# Patient Record
Sex: Female | Born: 1960 | Hispanic: No | Smoking: Former smoker
Health system: Southern US, Community
[De-identification: ages and names within clinical notes are randomized; demographics above are authoritative.]

## PROBLEM LIST (undated history)

## (undated) DIAGNOSIS — I639 Cerebral infarction, unspecified: Secondary | ICD-10-CM

## (undated) DIAGNOSIS — M1990 Unspecified osteoarthritis, unspecified site: Secondary | ICD-10-CM

## (undated) DIAGNOSIS — E119 Type 2 diabetes mellitus without complications: Secondary | ICD-10-CM

## (undated) DIAGNOSIS — M069 Rheumatoid arthritis, unspecified: Secondary | ICD-10-CM

## (undated) HISTORY — PX: HAND SURGERY: SHX662

---

## 2001-12-27 ENCOUNTER — Encounter: Admission: RE | Admit: 2001-12-27 | Discharge: 2001-12-27 | Payer: Self-pay | Admitting: Neurosurgery

## 2001-12-27 ENCOUNTER — Encounter: Payer: Self-pay | Admitting: Neurosurgery

## 2002-06-14 ENCOUNTER — Encounter: Admission: RE | Admit: 2002-06-14 | Discharge: 2002-06-14 | Payer: Self-pay

## 2002-06-27 ENCOUNTER — Other Ambulatory Visit: Admission: RE | Admit: 2002-06-27 | Discharge: 2002-06-27 | Payer: Self-pay | Admitting: *Deleted

## 2003-06-03 ENCOUNTER — Encounter: Admission: RE | Admit: 2003-06-03 | Discharge: 2003-06-03 | Payer: Self-pay

## 2005-01-30 ENCOUNTER — Encounter: Admission: RE | Admit: 2005-01-30 | Discharge: 2005-01-30 | Payer: Self-pay

## 2006-07-20 ENCOUNTER — Ambulatory Visit: Payer: Self-pay | Admitting: Vascular Surgery

## 2006-12-16 ENCOUNTER — Ambulatory Visit (HOSPITAL_COMMUNITY): Admission: RE | Admit: 2006-12-16 | Discharge: 2006-12-16 | Payer: Self-pay

## 2011-03-25 ENCOUNTER — Other Ambulatory Visit: Payer: Self-pay | Admitting: Internal Medicine

## 2011-03-25 DIAGNOSIS — M542 Cervicalgia: Secondary | ICD-10-CM

## 2011-03-31 ENCOUNTER — Inpatient Hospital Stay: Admission: RE | Admit: 2011-03-31 | Payer: Self-pay | Source: Ambulatory Visit

## 2011-04-09 ENCOUNTER — Ambulatory Visit
Admission: RE | Admit: 2011-04-09 | Discharge: 2011-04-09 | Disposition: A | Discharge status: Home / Self Care | Payer: Medicare Other | Source: Ambulatory Visit | Attending: Internal Medicine | Admitting: Internal Medicine

## 2011-04-09 DIAGNOSIS — M542 Cervicalgia: Secondary | ICD-10-CM

## 2011-09-23 ENCOUNTER — Other Ambulatory Visit: Payer: Self-pay | Admitting: Internal Medicine

## 2011-09-23 DIAGNOSIS — M25511 Pain in right shoulder: Secondary | ICD-10-CM

## 2011-10-20 ENCOUNTER — Ambulatory Visit
Admission: RE | Admit: 2011-10-20 | Discharge: 2011-10-20 | Disposition: A | Discharge status: Home / Self Care | Payer: Medicare Other | Source: Ambulatory Visit | Attending: Internal Medicine | Admitting: Internal Medicine

## 2011-10-20 DIAGNOSIS — M25511 Pain in right shoulder: Secondary | ICD-10-CM

## 2014-08-15 ENCOUNTER — Inpatient Hospital Stay (HOSPITAL_COMMUNITY): Payer: Medicare Other

## 2014-08-15 ENCOUNTER — Ambulatory Visit (HOSPITAL_COMMUNITY)
Admission: RE | Admit: 2014-08-15 | Discharge: 2014-08-15 | Disposition: A | Discharge status: Home / Self Care | Payer: Medicare Other | Source: Ambulatory Visit | Attending: Endocrinology | Admitting: Endocrinology

## 2014-08-15 ENCOUNTER — Other Ambulatory Visit: Payer: Self-pay

## 2014-08-15 ENCOUNTER — Inpatient Hospital Stay (HOSPITAL_COMMUNITY)
Admission: EM | Admit: 2014-08-15 | Discharge: 2014-08-16 | DRG: 066 | Disposition: A | Discharge status: Home / Self Care | Payer: Medicare Other | Attending: Internal Medicine | Admitting: Internal Medicine

## 2014-08-15 ENCOUNTER — Other Ambulatory Visit (HOSPITAL_COMMUNITY): Payer: Self-pay | Admitting: Endocrinology

## 2014-08-15 ENCOUNTER — Encounter (HOSPITAL_COMMUNITY): Payer: Self-pay | Admitting: *Deleted

## 2014-08-15 DIAGNOSIS — Z9641 Presence of insulin pump (external) (internal): Secondary | ICD-10-CM | POA: Diagnosis present

## 2014-08-15 DIAGNOSIS — Z720 Tobacco use: Secondary | ICD-10-CM | POA: Diagnosis present

## 2014-08-15 DIAGNOSIS — I639 Cerebral infarction, unspecified: Secondary | ICD-10-CM | POA: Diagnosis present

## 2014-08-15 DIAGNOSIS — Z794 Long term (current) use of insulin: Secondary | ICD-10-CM | POA: Diagnosis not present

## 2014-08-15 DIAGNOSIS — F329 Major depressive disorder, single episode, unspecified: Secondary | ICD-10-CM | POA: Diagnosis present

## 2014-08-15 DIAGNOSIS — E785 Hyperlipidemia, unspecified: Secondary | ICD-10-CM | POA: Diagnosis present

## 2014-08-15 DIAGNOSIS — Z79899 Other long term (current) drug therapy: Secondary | ICD-10-CM | POA: Diagnosis not present

## 2014-08-15 DIAGNOSIS — E109 Type 1 diabetes mellitus without complications: Secondary | ICD-10-CM | POA: Diagnosis present

## 2014-08-15 DIAGNOSIS — I6789 Other cerebrovascular disease: Secondary | ICD-10-CM | POA: Diagnosis not present

## 2014-08-15 DIAGNOSIS — R200 Anesthesia of skin: Secondary | ICD-10-CM | POA: Diagnosis present

## 2014-08-15 DIAGNOSIS — F17210 Nicotine dependence, cigarettes, uncomplicated: Secondary | ICD-10-CM | POA: Diagnosis present

## 2014-08-15 DIAGNOSIS — G510 Bell's palsy: Secondary | ICD-10-CM

## 2014-08-15 DIAGNOSIS — M069 Rheumatoid arthritis, unspecified: Secondary | ICD-10-CM | POA: Diagnosis present

## 2014-08-15 HISTORY — DX: Unspecified osteoarthritis, unspecified site: M19.90

## 2014-08-15 HISTORY — DX: Cerebral infarction, unspecified: I63.9

## 2014-08-15 HISTORY — DX: Type 2 diabetes mellitus without complications: E11.9

## 2014-08-15 HISTORY — DX: Rheumatoid arthritis, unspecified: M06.9

## 2014-08-15 LAB — I-STAT CHEM 8, ED
BUN: 9 mg/dL (ref 6–20)
Calcium, Ion: 1.22 mmol/L (ref 1.12–1.23)
Chloride: 101 mmol/L (ref 101–111)
Creatinine, Ser: 0.5 mg/dL (ref 0.44–1.00)
Glucose, Bld: 142 mg/dL — ABNORMAL HIGH (ref 70–99)
HCT: 42 % (ref 36.0–46.0)
Hemoglobin: 14.3 g/dL (ref 12.0–15.0)
Potassium: 3.8 mmol/L (ref 3.5–5.1)
Sodium: 140 mmol/L (ref 135–145)
TCO2: 23 mmol/L (ref 0–100)

## 2014-08-15 LAB — DIFFERENTIAL
Basophils Absolute: 0.1 10*3/uL (ref 0.0–0.1)
Basophils Relative: 2 % — ABNORMAL HIGH (ref 0–1)
Eosinophils Absolute: 0.2 10*3/uL (ref 0.0–0.7)
Eosinophils Relative: 3 % (ref 0–5)
Lymphocytes Relative: 39 % (ref 12–46)
Lymphs Abs: 1.8 10*3/uL (ref 0.7–4.0)
Monocytes Absolute: 0.3 10*3/uL (ref 0.1–1.0)
Monocytes Relative: 6 % (ref 3–12)
Neutro Abs: 2.4 10*3/uL (ref 1.7–7.7)
Neutrophils Relative %: 50 % (ref 43–77)

## 2014-08-15 LAB — COMPREHENSIVE METABOLIC PANEL
ALK PHOS: 120 U/L (ref 38–126)
ALT: 14 U/L (ref 14–54)
ANION GAP: 9 (ref 5–15)
AST: 22 U/L (ref 15–41)
Albumin: 3.9 g/dL (ref 3.5–5.0)
BUN: 8 mg/dL (ref 6–20)
CO2: 26 mmol/L (ref 22–32)
Calcium: 9.3 mg/dL (ref 8.9–10.3)
Chloride: 104 mmol/L (ref 101–111)
Creatinine, Ser: 0.44 mg/dL (ref 0.44–1.00)
GFR calc Af Amer: >60 mL/min (ref >60)
GFR calc non Af Amer: >60 mL/min (ref >60)
Glucose, Bld: 143 mg/dL — ABNORMAL HIGH (ref 70–99)
Potassium: 3.7 mmol/L (ref 3.5–5.1)
Sodium: 139 mmol/L (ref 135–145)
TOTAL PROTEIN: 6.8 g/dL (ref 6.5–8.1)
Total Bilirubin: 0.5 mg/dL (ref 0.3–1.2)

## 2014-08-15 LAB — CBC
HCT: 40.4 % (ref 36.0–46.0)
Hemoglobin: 13.8 g/dL (ref 12.0–15.0)
MCH: 31 pg (ref 26.0–34.0)
MCHC: 34.2 g/dL (ref 30.0–36.0)
MCV: 90.8 fL (ref 78.0–100.0)
Platelets: 200 10*3/uL (ref 150–400)
RBC: 4.45 MIL/uL (ref 3.87–5.11)
RDW: 13.8 % (ref 11.5–15.5)
WBC: 4.7 10*3/uL (ref 4.0–10.5)

## 2014-08-15 LAB — GLUCOSE, CAPILLARY: Glucose-Capillary: 145 mg/dL — ABNORMAL HIGH (ref 70–99)

## 2014-08-15 LAB — CBG MONITORING, ED: Glucose-Capillary: 120 mg/dL — ABNORMAL HIGH (ref 70–99)

## 2014-08-15 LAB — I-STAT TROPONIN, ED: Troponin i, poc: 0 ng/mL (ref 0.00–0.08)

## 2014-08-15 LAB — PROTIME-INR
INR: 0.95 (ref 0.00–1.49)
Prothrombin Time: 12.8 seconds (ref 11.6–15.2)

## 2014-08-15 LAB — APTT: APTT: 27 s (ref 24–37)

## 2014-08-15 MED ORDER — ASPIRIN 300 MG RE SUPP: 300.0000 mg | Freq: Every day | RECTAL | Status: DC

## 2014-08-15 MED ORDER — STROKE: EARLY STAGES OF RECOVERY BOOK
Freq: Once | Status: AC
  Administered 2014-08-15: 22:00:00

## 2014-08-15 MED ORDER — ZOLPIDEM TARTRATE 5 MG PO TABS: 10.0000 mg | ORAL_TABLET | Freq: Every evening | ORAL | Status: DC | PRN

## 2014-08-15 MED ORDER — SODIUM CHLORIDE 0.9 % IV SOLN
INTRAVENOUS | Status: DC
  Administered 2014-08-15: 22:00:00 via INTRAVENOUS

## 2014-08-15 MED ORDER — ASPIRIN 325 MG PO TABS
325.0000 mg | ORAL_TABLET | Freq: Every day | ORAL | Status: DC
  Administered 2014-08-15 – 2014-08-16 (×2): 325 mg via ORAL
  Filled 2014-08-15 (×2): qty 1

## 2014-08-15 MED ORDER — ENOXAPARIN SODIUM 40 MG/0.4ML ~~LOC~~ SOLN
40.0000 mg | SUBCUTANEOUS | Status: DC
  Administered 2014-08-16: 40 mg via SUBCUTANEOUS
  Filled 2014-08-15: qty 0.4

## 2014-08-15 MED ORDER — SENNOSIDES-DOCUSATE SODIUM 8.6-50 MG PO TABS: 1.0000 | ORAL_TABLET | Freq: Every evening | ORAL | Status: DC | PRN

## 2014-08-15 MED ORDER — ZOLPIDEM TARTRATE 5 MG PO TABS
5.0000 mg | ORAL_TABLET | Freq: Every evening | ORAL | Status: DC | PRN
  Administered 2014-08-15: 5 mg via ORAL
  Filled 2014-08-15: qty 1

## 2014-08-15 MED ORDER — VENLAFAXINE HCL ER 75 MG PO CP24
75.0000 mg | ORAL_CAPSULE | Freq: Every day | ORAL | Status: DC
  Administered 2014-08-16: 75 mg via ORAL
  Filled 2014-08-15: qty 1

## 2014-08-15 MED ORDER — INSULIN PUMP
SUBCUTANEOUS | Status: DC
  Administered 2014-08-15: 0.3 via SUBCUTANEOUS
  Administered 2014-08-16: 0.4 via SUBCUTANEOUS
  Administered 2014-08-16: 12:00:00 via SUBCUTANEOUS
  Administered 2014-08-16: 0.1 via SUBCUTANEOUS
  Filled 2014-08-15: qty 1

## 2014-08-15 NOTE — ED Notes (Signed)
She woke up this am with rt sided fascial numbness since 0900am today with numbness in the entire rt side of her body.  She was seen by her doctor this afternoon and had a mri at [facility].  She was called there and was told she needed to come  Straight here.  Her numbness continues about the same

## 2014-08-15 NOTE — ED Notes (Signed)
Pt reports numbness on right side of her body since this am, went to see her endocrinologist who sent her to [facility] for an mri then sent her here. Pt states hx of tia in the past. Speech clear, airway intact, no weakness noted. Pt denies headache.

## 2014-08-15 NOTE — ED Notes (Signed)
The pt has an insulin pump

## 2014-08-15 NOTE — ED Notes (Signed)
i cannot locate any one that knows  Who is supposed to see this pt.   Dr Thad Ranger is in the dept she does not know anything about this pt.

## 2014-08-15 NOTE — ED Provider Notes (Signed)
CSN: [id number]     Arrival date & time 08/15/14  1818 History   First MD Initiated Contact with Patient 08/15/14 1936     Chief Complaint  Patient presents with  . Numbness   Patient is a 54 y.o. female presenting with neurologic complaint. The history is provided by the patient.  Neurologic Problem This is a new problem. Episode onset: pt noticed it when she woke up this morning, last normal was last night. The problem occurs constantly. The problem has not changed since onset.Pertinent negatives include no chest pain, no abdominal pain and no headaches. Nothing aggravates the symptoms. Nothing relieves the symptoms. She has tried nothing for the symptoms.  Numbness on the right side of face and right arm.  Leg feels normal.  No trouble with balance or speech.  No vision complaints.  Past Medical History  Diagnosis Date  . Arthritis   . Diabetes mellitus without complication   . Stroke    History reviewed. No pertinent past surgical history. No family history on file. History  Substance Use Topics  . Smoking status: Current Every Day Smoker  . Smokeless tobacco: Not on file  . Alcohol Use: No   OB History    No data available     Review of Systems  Cardiovascular: Negative for chest pain.  Gastrointestinal: Negative for abdominal pain.  Neurological: Negative for headaches.  All other systems reviewed and are negative.     Allergies  Review of patient's allergies indicates no known allergies.  Home Medications   Prior to Admission medications   Not on File   BP 155/91 mmHg  Pulse 82  Temp(Src) 98.9 F (37.2 C)  Resp 12  Ht 5\' 4"  (1.626 m)  Wt 132 lb (59.875 kg)  BMI 22.65 kg/m2  SpO2 100% Physical Exam  Constitutional: She is oriented to person, place, and time. She appears well-developed and well-nourished. No distress.  HENT:  Head: Normocephalic and atraumatic.  Right Ear: External ear normal.  Left Ear: External ear normal.  Mouth/Throat: Oropharynx  is clear and moist.  Eyes: Conjunctivae are normal. Right eye exhibits no discharge. Left eye exhibits no discharge. No scleral icterus.  Neck: Neck supple. No tracheal deviation present.  Cardiovascular: Normal rate, regular rhythm and intact distal pulses.   Pulmonary/Chest: Effort normal and breath sounds normal. No stridor. No respiratory distress. She has no wheezes. She has no rales.  Abdominal: Soft. Bowel sounds are normal. She exhibits no distension. There is no tenderness. There is no rebound and no guarding.  Musculoskeletal: She exhibits no edema or tenderness.  Neurological: She is alert and oriented to person, place, and time. She has normal strength. A sensory deficit is present. No cranial nerve deficit (No facial droop, extraocular movements intact, tongue midline ). She exhibits normal muscle tone. She displays no seizure activity. Coordination normal.  No pronator drift bilateral upper extrem, able to hold both legs off bed for 5 seconds, sensation intact in all extremities but decreased rue, no visual field cuts, no left or right sided neglect, normal finger-nose exam bilaterally, no nystagmus noted   Skin: Skin is warm and dry. No rash noted.  Psychiatric: She has a normal mood and affect.  Nursing note and vitals reviewed.   ED Course  Procedures (including critical care time) Labs Review Labs Reviewed  DIFFERENTIAL - Abnormal; Notable for the following:    Basophils Relative 2 (*)    All other components within normal limits  I-STAT CHEM 8,  ED - Abnormal; Notable for the following:    Glucose, Bld 142 (*)    All other components within normal limits  PROTIME-INR  APTT  CBC  COMPREHENSIVE METABOLIC PANEL  I-STAT TROPOININ, ED  CBG MONITORING, ED    Imaging Review Mr Brain Wo Contrast  08/15/2014   CLINICAL DATA:  RIGHT-sided facial numbness extending to the RIGHT upper extremity that began earlier today.  EXAM: MRI HEAD WITHOUT CONTRAST  TECHNIQUE: Multiplanar,  multiecho pulse sequences of the brain and surrounding structures were obtained without intravenous contrast.  COMPARISON:  MR brain report 01/29/2012.  FINDINGS: There is a 5 x 8 mm area of restricted diffusion involving the LEFT thalamus consistent with an acute infarction. No other areas of restricted diffusion are seen.  No hemorrhage, mass lesion, or extra-axial fluid. Moderately advanced atrophy for age. Chronic microvascular ischemic change of a moderately extensive degree affects the periventricular and subcortical white matter, also the brainstem. No midline abnormalities.  Patent flow voids in the carotid, basilar, and vertebral arteries. No visible proximal large vessel occlusion intracranially, specifically with regard to the LEFT PCA.  Pituitary, pineal, and cerebellar tonsils unremarkable. No upper cervical lesions. Visualized calvarium, skull base, and upper cervical osseous structures unremarkable. Scalp and extracranial soft tissues, orbits, sinuses, and mastoids show no acute process.  Compared to the report from [facility], atrophy and small vessel disease were described previously.  IMPRESSION: Acute nonhemorrhagic subcentimeter infarct involving the LEFT thalamus. The location of this abnormality would explain the patient's sensory symptoms.  No evidence for intracranial large vessel occlusion. The observed infarction likely represents a small vessel insult.  Moderately advanced atrophy for age with extensive chronic microvascular ischemic change.  These results will be called to the ordering clinician or representative by the Radiologist Assistant, and communication documented in the PACS or zVision Dashboard.   Electronically Signed   By: Davonna Belling M.D.   On: 08/15/2014 16:14    EKG Normal sinus rhythm rate 81 Normal axis, normal intervals No prior EKG for comparison  MDM   Final diagnoses:  Stroke    Patient presents to emergency room with a confirmed stroke demonstrated on an  MRI performed as an outpatient today. Not a TPA or vascular intervention candidate.  The last time she was known to be asymptomatic was instructed to bed last evening.  I will consult with the medical service for admission and further stroke workup.   Linwood Dibbles, MD 08/15/14 2027

## 2014-08-15 NOTE — ED Notes (Signed)
The pts husband called the pts doctor and was told that she had a stroke and was supposed to see the stroke specialist on call.  Dr Thad Ranger is here in the dept and she has no idea  About this pt.

## 2014-08-15 NOTE — ED Notes (Signed)
MD at bedside. 

## 2014-08-15 NOTE — ED Notes (Signed)
BLOOD GLUCOSE 120

## 2014-08-15 NOTE — Consult Note (Signed)
Referring Physician: Dr. Hal Hope    Chief Complaint: numbness right face-arm, stroke on MRI  HPI:                                                                                                                                         Joyce Steele is an 54 y.o. female, right handed, with a past medical history significant for DM type 1, RA, smoking, admitted to [facility] for further evaluation of the above stated symptoms. Stated that she never had similar symptoms before. Went to bed last night feeling well but woke up around 8:45 this morning with a numb sensation involving her right face including right side of her lips and tongue as well as the right arm. She denied associated HA, vertigo, double vision, difficulty swallowing, focal weakness, unsteadiness, confusion, slurred speech, language or vision impairment. She indicated that her symptoms did not get better and drove to her endocrinologist office who sent her to [facility] for urgent MRI brain. I did review her brain MRI and there is evidence of an acute left lacunar infarct. Joyce Steele said that her symptoms remain unchanged.  Date last known well: 08/14/14 Time last known well: undetermined tPA Given: no, out of the window   Past Medical History  Diagnosis Date  . Arthritis   . Diabetes mellitus without complication   . Stroke   . Rheumatoid arthritis     Past Surgical History  Procedure Laterality Date  . Hand surgery      Family History  Problem Relation Age of Onset  . Stroke Mother   . Rheum arthritis Mother    Social History:  reports that she has been smoking.  She does not have any smokeless tobacco history on file. She reports that she does not drink alcohol. Her drug history is not on file. Family history: mother had large stroke at age 32, no brain tumors, epilepsy, MS, or brain aneurysms Allergies: No Known Allergies  Medications:                                                                                                                            Scheduled: . aspirin  300 mg Rectal Daily   Or  . aspirin  325 mg Oral Daily  . [START ON 08/16/2014] enoxaparin (LOVENOX) injection  40 mg Subcutaneous Q24H  . insulin pump  Subcutaneous 6 times per day  . [START ON 08/16/2014] venlafaxine XR  75 mg Oral Q breakfast    ROS:                                                                                                                                       History obtained from the patient and chart review  General ROS: negative for - chills, fatigue, fever, night sweats, weight gain or weight loss Psychological ROS: negative for - behavioral disorder, hallucinations, memory difficulties, mood swings or suicidal ideation Ophthalmic ROS: negative for - blurry vision, double vision, eye pain or loss of vision ENT ROS: negative for - epistaxis, nasal discharge, oral lesions, sore throat, tinnitus or vertigo Allergy and Immunology ROS: negative for - hives or itchy/watery eyes Hematological and Lymphatic ROS: negative for - bleeding problems, bruising or swollen lymph nodes Endocrine ROS: negative for - galactorrhea, hair pattern changes, polydipsia/polyuria or temperature intolerance Respiratory ROS: negative for - cough, hemoptysis, shortness of breath or wheezing Cardiovascular ROS: negative for - chest pain, dyspnea on exertion, edema or irregular heartbeat Gastrointestinal ROS: negative for - abdominal pain, diarrhea, hematemesis, nausea/vomiting or stool incontinence Genito-Urinary ROS: negative for - dysuria, hematuria, incontinence or urinary frequency/urgency Musculoskeletal ROS: negative for - joint swelling or muscular weakness Neurological ROS: as noted in HPI Dermatological ROS: negative for rash and skin lesion changes   Physical exam: pleasant female in no apparent distress. Blood pressure 165/82, pulse 83, temperature 98.6 F (37 C), temperature source Oral, resp. rate 16, height 5' 4"  (1.626  m), weight 59.875 kg (132 lb), SpO2 98 %. Head: normocephalic. Neck: supple, no bruits, no JVD. Cardiac: no murmurs. Lungs: clear. Abdomen: soft, no tender, no mass. Extremities: no edema, clubbing, or cyanosis. Skin: no rash Neurologic Examination:                                                                                                      General: Mental Status: Alert, oriented, thought content appropriate.  Speech fluent without evidence of aphasia.  Able to follow 3 step commands without difficulty. Cranial Nerves: II: Discs flat bilaterally; Visual fields grossly normal, pupils equal, round, reactive to light and accommodation III,IV, VI: ptosis not present, extra-ocular motions intact bilaterally V,VII: smile symmetric, facial light touch sensation normal bilaterally VIII: hearing normal bilaterally IX,X: uvula rises symmetrically XI: bilateral shoulder shrug XII: midline tongue extension without atrophy or fasciculations  Motor: Right : Upper extremity   5/5  Left:     Upper extremity   5/5  Lower extremity   5/5     Lower extremity   5/5 Tone and bulk:normal tone throughout; no atrophy noted Sensory: Pinprick and light touch intact throughout, bilaterally Deep Tendon Reflexes:  Right: Upper Extremity   Left: Upper extremity   biceps (C-5 to C-6) 2/4   biceps (C-5 to C-6) 2/4 tricep (C7) 2/4    triceps (C7) 2/4 Brachioradialis (C6) 2/4  Brachioradialis (C6) 2/4  Lower Extremity Lower Extremity  quadriceps (L-2 to L-4) 2/4   quadriceps (L-2 to L-4) 2/4 Achilles (S1) 2/4   Achilles (S1) 2/4  Plantars: Right: downgoing   Left: downgoing Cerebellar: normal finger-to-nose,  normal heel-to-shin test Gait:  No tested due to multiple leads     Results for orders placed or performed during the hospital encounter of 08/15/14 (from the past 48 hour(s))  Protime-INR     Status: None   Collection Time: 08/15/14  7:06 PM  Result Value Ref Range   Prothrombin Time  12.8 11.6 - 15.2 seconds   INR 0.95 0.00 - 1.49  APTT     Status: None   Collection Time: 08/15/14  7:06 PM  Result Value Ref Range   aPTT 27 24 - 37 seconds  CBC     Status: None   Collection Time: 08/15/14  7:06 PM  Result Value Ref Range   WBC 4.7 4.0 - 10.5 K/uL   RBC 4.45 3.87 - 5.11 MIL/uL   Hemoglobin 13.8 12.0 - 15.0 g/dL   HCT 40.4 36.0 - 46.0 %   MCV 90.8 78.0 - 100.0 fL   MCH 31.0 26.0 - 34.0 pg   MCHC 34.2 30.0 - 36.0 g/dL   RDW 13.8 11.5 - 15.5 %   Platelets 200 150 - 400 K/uL  Differential     Status: Abnormal   Collection Time: 08/15/14  7:06 PM  Result Value Ref Range   Neutrophils Relative % 50 43 - 77 %   Neutro Abs 2.4 1.7 - 7.7 K/uL   Lymphocytes Relative 39 12 - 46 %   Lymphs Abs 1.8 0.7 - 4.0 K/uL   Monocytes Relative 6 3 - 12 %   Monocytes Absolute 0.3 0.1 - 1.0 K/uL   Eosinophils Relative 3 0 - 5 %   Eosinophils Absolute 0.2 0.0 - 0.7 K/uL   Basophils Relative 2 (H) 0 - 1 %   Basophils Absolute 0.1 0.0 - 0.1 K/uL  Comprehensive metabolic panel     Status: Abnormal   Collection Time: 08/15/14  7:06 PM  Result Value Ref Range   Sodium 139 135 - 145 mmol/L   Potassium 3.7 3.5 - 5.1 mmol/L   Chloride 104 101 - 111 mmol/L   CO2 26 22 - 32 mmol/L   Glucose, Bld 143 (H) 70 - 99 mg/dL   BUN 8 6 - 20 mg/dL   Creatinine, Ser 0.44 0.44 - 1.00 mg/dL   Calcium 9.3 8.9 - 10.3 mg/dL   Total Protein 6.8 6.5 - 8.1 g/dL   Albumin 3.9 3.5 - 5.0 g/dL   AST 22 15 - 41 U/L   ALT 14 14 - 54 U/L   Alkaline Phosphatase 120 38 - 126 U/L   Total Bilirubin 0.5 0.3 - 1.2 mg/dL   GFR calc non Af Amer >60 >60 mL/min   GFR calc Af Amer >60 >60 mL/min    Comment: (NOTE) The eGFR has been calculated using the CKD EPI  equation. This calculation has not been validated in all clinical situations. eGFR's persistently <90 mL/min signify possible Chronic Kidney Disease.    Anion gap 9 5 - 15  I-stat troponin, ED (not at [facility], [facility])     Status: None   Collection Time: 08/15/14   7:25 PM  Result Value Ref Range   Troponin i, poc 0.00 0.00 - 0.08 ng/mL   Comment 3            Comment: Due to the release kinetics of cTnI, a negative result within the first hours of the onset of symptoms does not rule out myocardial infarction with certainty. If myocardial infarction is still suspected, repeat the test at appropriate intervals.   I-Stat Chem 8, ED  (not at [facility], [facility])     Status: Abnormal   Collection Time: 08/15/14  7:27 PM  Result Value Ref Range   Sodium 140 135 - 145 mmol/L   Potassium 3.8 3.5 - 5.1 mmol/L   Chloride 101 101 - 111 mmol/L   BUN 9 6 - 20 mg/dL   Creatinine, Ser 0.50 0.44 - 1.00 mg/dL   Glucose, Bld 142 (H) 70 - 99 mg/dL   Calcium, Ion 1.22 1.12 - 1.23 mmol/L   TCO2 23 0 - 100 mmol/L   Hemoglobin 14.3 12.0 - 15.0 g/dL   HCT 42.0 36.0 - 46.0 %  CBG monitoring, ED     Status: Abnormal   Collection Time: 08/15/14  8:07 PM  Result Value Ref Range   Glucose-Capillary 120 (H) 70 - 99 mg/dL   Mr Brain Wo Contrast  08/15/2014   CLINICAL DATA:  RIGHT-sided facial numbness extending to the RIGHT upper extremity that began earlier today.  EXAM: MRI HEAD WITHOUT CONTRAST  TECHNIQUE: Multiplanar, multiecho pulse sequences of the brain and surrounding structures were obtained without intravenous contrast.  COMPARISON:  MR brain report 01/29/2012.  FINDINGS: There is a 5 x 8 mm area of restricted diffusion involving the LEFT thalamus consistent with an acute infarction. No other areas of restricted diffusion are seen.  No hemorrhage, mass lesion, or extra-axial fluid. Moderately advanced atrophy for age. Chronic microvascular ischemic change of a moderately extensive degree affects the periventricular and subcortical white matter, also the brainstem. No midline abnormalities.  Patent flow voids in the carotid, basilar, and vertebral arteries. No visible proximal large vessel occlusion intracranially, specifically with regard to the LEFT PCA.  Pituitary, pineal,  and cerebellar tonsils unremarkable. No upper cervical lesions. Visualized calvarium, skull base, and upper cervical osseous structures unremarkable. Scalp and extracranial soft tissues, orbits, sinuses, and mastoids show no acute process.  Compared to the report from [facility], atrophy and small vessel disease were described previously.  IMPRESSION: Acute nonhemorrhagic subcentimeter infarct involving the LEFT thalamus. The location of this abnormality would explain the patient's sensory symptoms.  No evidence for intracranial large vessel occlusion. The observed infarction likely represents a small vessel insult.  Moderately advanced atrophy for age with extensive chronic microvascular ischemic change.  These results will be called to the ordering clinician or representative by the Radiologist Assistant, and communication documented in the PACS or zVision Dashboard.   Electronically Signed   By: Rolla Flatten M.D.   On: 08/15/2014 16:14    Assessment: 54 y.o. female with new onset of right face-arm numbness and MRI brain confirming an acute lacunar infarct left thalamus, most likely due to small vessel disease. Complete stroke work up. Aspirin 81 mg daily. Stroke team will resume care tomorrow.  Stroke Risk Factors - DM  Plan: 1. HgbA1c, fasting lipid panel 2. MRI, MRA  of the brain without contrast 3. Echocardiogram 4. Carotid dopplers 5. Prophylactic therapy-ASPIRIN 6. Risk factor modification 7. Telemetry monitoring 8. Frequent neuro checks 9. PT/OT SLP (no needed at this time)  Dorian Pod, MD [facility] [phone]  08/15/2014, 9:56 PM

## 2014-08-15 NOTE — H&P (Signed)
[facility] History and Physical  Vanya K. Leonor Liv JIR:[id number] DOB: 1960/07/21 DOA: 08/15/2014  Referring physician: Dr. Lynelle Doctor. PCP: No primary care provider on file. Dr. Evlyn Kanner. Specialists: Dr. Dierdre Forth. Rheumatologist.  Chief Complaint: Right facial numbness.  HPI: Joyce Steele is a 54 y.o. female with history of diabetes mellitus type 1 on insulin pump, ongoing tobacco abuse, rheumatoid arthritis presently on no medication started experiencing right facial numbness after waking up in the morning 9 AM. Patient's numbness was on the entire right face involving the scalp also. Patient also felt that she had some discoordination of her right upper extremity. Denies any visual symptoms difficulty talking swallowing or loss of function of upper or lower extremities. Patient had gone to her PCP when ordered MRI brain which showed left thalamic stroke and patient was referred to the ER. Patient otherwise denies any chest pain or shortness of breath nausea vomiting abdominal pain loss of consciousness palpitations.   Review of Systems: As presented in the history of presenting illness, rest negative.  Past Medical History  Diagnosis Date  . Arthritis   . Diabetes mellitus without complication   . Stroke   . Rheumatoid arthritis    Past Surgical History  Procedure Laterality Date  . Hand surgery     Social History:  reports that she has been smoking.  She does not have any smokeless tobacco history on file. She reports that she does not drink alcohol. Her drug history is not on file. Where does patient live home. Can patient participate in ADLs? Yes.  No Known Allergies  Family History:  Family History  Problem Relation Age of Onset  . Stroke Mother   . Rheum arthritis Mother       Prior to Admission medications   Medication Sig Start Date End Date Taking? Authorizing Provider  insulin aspart (NOVOLOG) 100 UNIT/ML injection Inject 30 Units into the skin 3 (three) times daily  before meals.   Yes Historical Provider, MD  venlafaxine XR (EFFEXOR-XR) 75 MG 24 hr capsule Take 75 mg by mouth daily with breakfast.   Yes Historical Provider, MD  zolpidem (AMBIEN CR) 12.5 MG CR tablet Take 12.5 mg by mouth at bedtime. Take every night per patient   Yes Historical Provider, MD    Physical Exam: Filed Vitals:   08/15/14 2034 08/15/14 2040 08/15/14 2045 08/15/14 2107  BP: 156/73  156/83 165/82  Pulse: 85 87 88 83  Temp:    98.6 F (37 C)  TempSrc:    Oral  Resp:  14 14 16   Height:      Weight:      SpO2: 98% 100% 99% 98%     General:  Well-developed and nourished.  Eyes: Anicteric no pallor.  ENT: No discharge from the ears eyes nose or mouth.  Neck: No mass felt.  Cardiovascular: S1 and S2 heard.  Respiratory: No rhonchi or crepitations.  Abdomen: Soft nontender bowel sounds present.  Skin: No rash.  Musculoskeletal: No edema.  Psychiatric: Appears normal.  Neurologic: Alert awake oriented to time place and person. Moves all extremities 5 x 5. No facial asymmetry. Tongue is midline. PERRLA positive.  Labs on Admission:  Basic Metabolic Panel:  Recent Labs Lab 08/15/14 1906 08/15/14 1927  NA 139 140  K 3.7 3.8  CL 104 101  CO2 26  --   GLUCOSE 143* 142*  BUN 8 9  CREATININE 0.44 0.50  CALCIUM 9.3  --    Liver Function Tests:  Recent  Labs Lab 08/15/14 1906  AST 22  ALT 14  ALKPHOS 120  BILITOT 0.5  PROT 6.8  ALBUMIN 3.9   No results for input(s): LIPASE, AMYLASE in the last 168 hours. No results for input(s): AMMONIA in the last 168 hours. CBC:  Recent Labs Lab 08/15/14 1906 08/15/14 1927  WBC 4.7  --   NEUTROABS 2.4  --   HGB 13.8 14.3  HCT 40.4 42.0  MCV 90.8  --   PLT 200  --    Cardiac Enzymes: No results for input(s): CKTOTAL, CKMB, CKMBINDEX, TROPONINI in the last 168 hours.  BNP (last 3 results) No results for input(s): BNP in the last 8760 hours.  ProBNP (last 3 results) No results for input(s):  PROBNP in the last 8760 hours.  CBG:  Recent Labs Lab 08/15/14 2007  GLUCAP 120*    Radiological Exams on Admission: Mr Brain Wo Contrast  08/15/2014   CLINICAL DATA:  RIGHT-sided facial numbness extending to the RIGHT upper extremity that began earlier today.  EXAM: MRI HEAD WITHOUT CONTRAST  TECHNIQUE: Multiplanar, multiecho pulse sequences of the brain and surrounding structures were obtained without intravenous contrast.  COMPARISON:  MR brain report 01/29/2012.  FINDINGS: There is a 5 x 8 mm area of restricted diffusion involving the LEFT thalamus consistent with an acute infarction. No other areas of restricted diffusion are seen.  No hemorrhage, mass lesion, or extra-axial fluid. Moderately advanced atrophy for age. Chronic microvascular ischemic change of a moderately extensive degree affects the periventricular and subcortical white matter, also the brainstem. No midline abnormalities.  Patent flow voids in the carotid, basilar, and vertebral arteries. No visible proximal large vessel occlusion intracranially, specifically with regard to the LEFT PCA.  Pituitary, pineal, and cerebellar tonsils unremarkable. No upper cervical lesions. Visualized calvarium, skull base, and upper cervical osseous structures unremarkable. Scalp and extracranial soft tissues, orbits, sinuses, and mastoids show no acute process.  Compared to the report from priors, atrophy and small vessel disease were described previously.  IMPRESSION: Acute nonhemorrhagic subcentimeter infarct involving the LEFT thalamus. The location of this abnormality would explain the patient's sensory symptoms.  No evidence for intracranial large vessel occlusion. The observed infarction likely represents a small vessel insult.  Moderately advanced atrophy for age with extensive chronic microvascular ischemic change.  These results will be called to the ordering clinician or representative by the Radiologist Assistant, and communication  documented in the PACS or zVision Dashboard.   Electronically Signed   By: Davonna Belling M.D.   On: 08/15/2014 16:14    EKG: Independently reviewed. Normal sinus rhythm.  Assessment/Plan Principal Problem:   Stroke Active Problems:   Rheumatoid arthritis   Diabetes mellitus type 1, controlled   Tobacco abuse   1. Stroke - I have discussed with Dr. Cyril Mourning on-call neurologist. At this time patient has been placed on stroke so evaluation and neuro checks and I have ordered MRA brain 2-D echo carotid Doppler and patient will be closely monitored in telemetry. Get physical therapy consult. 2. Diabetes mellitus type 1 on insulin pump - insulin pump will be continued. Patient states her blood sugars being well controlled and her recent hemoglobin A1c last month was 6.2. Closely follow CBGs. 3. Tobacco abuse - patient strongly advised to quit tobacco use. 4. Rheumatoid arthritis - presently on no medications.   DVT Prophylaxis Lovenox.  Code Status: Full code.  Family Communication: Discussed with patient.  Disposition Plan: Admit to inpatient.    Emett Stapel N. [facility] Pager [phone].  If 7PM-7AM, please contact night-coverage [email or web address] Password [facility] 08/15/2014, 9:27 PM

## 2014-08-16 ENCOUNTER — Inpatient Hospital Stay (HOSPITAL_COMMUNITY): Payer: Medicare Other

## 2014-08-16 ENCOUNTER — Other Ambulatory Visit: Payer: Self-pay | Admitting: Nurse Practitioner

## 2014-08-16 ENCOUNTER — Encounter (HOSPITAL_COMMUNITY): Payer: Self-pay | Admitting: Nurse Practitioner

## 2014-08-16 DIAGNOSIS — E785 Hyperlipidemia, unspecified: Secondary | ICD-10-CM

## 2014-08-16 DIAGNOSIS — I639 Cerebral infarction, unspecified: Principal | ICD-10-CM

## 2014-08-16 DIAGNOSIS — M069 Rheumatoid arthritis, unspecified: Secondary | ICD-10-CM

## 2014-08-16 DIAGNOSIS — I6789 Other cerebrovascular disease: Secondary | ICD-10-CM

## 2014-08-16 DIAGNOSIS — E109 Type 1 diabetes mellitus without complications: Secondary | ICD-10-CM

## 2014-08-16 DIAGNOSIS — Z720 Tobacco use: Secondary | ICD-10-CM

## 2014-08-16 LAB — COMPREHENSIVE METABOLIC PANEL
ALBUMIN: 3.4 g/dL — AB (ref 3.5–5.0)
ALT: 14 U/L (ref 14–54)
ANION GAP: 6 (ref 5–15)
AST: 19 U/L (ref 15–41)
Alkaline Phosphatase: 114 U/L (ref 38–126)
BUN: 9 mg/dL (ref 6–20)
CHLORIDE: 105 mmol/L (ref 101–111)
CO2: 28 mmol/L (ref 22–32)
Calcium: 9 mg/dL (ref 8.9–10.3)
Creatinine, Ser: 0.47 mg/dL (ref 0.44–1.00)
GFR calc Af Amer: >60 mL/min (ref >60)
GFR calc non Af Amer: >60 mL/min (ref >60)
Glucose, Bld: 117 mg/dL — ABNORMAL HIGH (ref 70–99)
Potassium: 3.9 mmol/L (ref 3.5–5.1)
SODIUM: 139 mmol/L (ref 135–145)
TOTAL PROTEIN: 6 g/dL — AB (ref 6.5–8.1)
Total Bilirubin: 0.5 mg/dL (ref 0.3–1.2)

## 2014-08-16 LAB — CBC WITH DIFFERENTIAL/PLATELET
BASOS ABS: 0.1 10*3/uL (ref 0.0–0.1)
BASOS PCT: 2 % — AB (ref 0–1)
EOS ABS: 0.3 10*3/uL (ref 0.0–0.7)
Eosinophils Relative: 6 % — ABNORMAL HIGH (ref 0–5)
HCT: 38.5 % (ref 36.0–46.0)
Hemoglobin: 13 g/dL (ref 12.0–15.0)
LYMPHS PCT: 50 % — AB (ref 12–46)
Lymphs Abs: 2.5 10*3/uL (ref 0.7–4.0)
MCH: 30.8 pg (ref 26.0–34.0)
MCHC: 33.8 g/dL (ref 30.0–36.0)
MCV: 91.2 fL (ref 78.0–100.0)
MONO ABS: 0.3 10*3/uL (ref 0.1–1.0)
Monocytes Relative: 6 % (ref 3–12)
Neutro Abs: 1.8 10*3/uL (ref 1.7–7.7)
Neutrophils Relative %: 36 % — ABNORMAL LOW (ref 43–77)
PLATELETS: 182 10*3/uL (ref 150–400)
RBC: 4.22 MIL/uL (ref 3.87–5.11)
RDW: 13.8 % (ref 11.5–15.5)
WBC: 5 10*3/uL (ref 4.0–10.5)

## 2014-08-16 LAB — GLUCOSE, CAPILLARY
GLUCOSE-CAPILLARY: 149 mg/dL — AB (ref 70–99)
GLUCOSE-CAPILLARY: 81 mg/dL (ref 70–99)
GLUCOSE-CAPILLARY: 189 mg/dL — AB (ref 70–99)
Glucose-Capillary: 154 mg/dL — ABNORMAL HIGH (ref 70–99)

## 2014-08-16 LAB — LIPID PANEL
Cholesterol: 217 mg/dL — ABNORMAL HIGH (ref 0–200)
HDL: 43 mg/dL (ref >40)
LDL CALC: 153 mg/dL — AB (ref 0–99)
TRIGLYCERIDES: 104 mg/dL (ref <150)
Total CHOL/HDL Ratio: 5 RATIO
VLDL: 21 mg/dL (ref 0–40)

## 2014-08-16 MED ORDER — ASPIRIN 325 MG PO TABS: 325.0000 mg | ORAL_TABLET | Freq: Every day | ORAL | Status: DC

## 2014-08-16 MED ORDER — ATORVASTATIN CALCIUM 40 MG PO TABS: 40.0000 mg | ORAL_TABLET | Freq: Every day | ORAL | Status: DC

## 2014-08-16 MED ORDER — ACETAMINOPHEN 325 MG PO TABS
650.0000 mg | ORAL_TABLET | Freq: Four times a day (QID) | ORAL | Status: DC | PRN
  Administered 2014-08-16: 650 mg via ORAL
  Filled 2014-08-16: qty 2

## 2014-08-16 NOTE — Progress Notes (Signed)
VASCULAR LAB PRELIMINARY  PRELIMINARY  PRELIMINARY  PRELIMINARY  Carotid Dopplers completed.    Preliminary report:  1-39% ICA stenosis.  Vertebral artery flow is antegrade.   Kent Braunschweig, RVT 08/16/2014, 9:33 AM

## 2014-08-16 NOTE — Progress Notes (Signed)
STROKE TEAM PROGRESS NOTE   HISTORY Joyce Steele is an 54 y.o. female, right handed, with a past medical history significant for DM type 1, RA, smoking, admitted to [facility] for further evaluation of numbness right face-arm. Stated that she never had similar symptoms before. Went to bed last night feeling well but woke up around 8:45 this morning with a numb sensation involving her right face including right side of her lips and tongue as well as the right arm. (LKW 08/14/2014, time unknown). She denied associated HA, vertigo, double vision, difficulty swallowing, focal weakness, unsteadiness, confusion, slurred speech, language or vision impairment. She indicated that her symptoms did not get better and drove to her endocrinologist office who sent her to [facility] for urgent MRI brain. I did review her brain MRI and there is evidence of an acute left lacunar infarct. Joyce Steele said that her symptoms remain unchanged. Patient was not administered TPA secondary to delay in arrival. She was admitted for further evaluation and treatment.   SUBJECTIVE (INTERVAL HISTORY) No family  is at the bedside.  Overall she feels her condition is stable. She still has some numbness in her face, otherwise, she is at baseline.   OBJECTIVE Temp:  [97.7 F (36.5 C)-98.9 F (37.2 C)] 98.3 F (36.8 C) (05/05 0945) Pulse Rate:  [78-99] 80 (05/05 0945) Cardiac Rhythm:  [-] Normal sinus rhythm (05/04 2107) Resp:  [12-18] 16 (05/05 0730) BP: (128-165)/(65-94) 150/66 mmHg (05/05 0945) SpO2:  [97 %-100 %] 100 % (05/05 0945) Weight:  [58.968 kg (130 lb)-59.875 kg (132 lb)] 59.875 kg (132 lb) (05/04 1832)   Recent Labs Lab 08/15/14 2007 08/15/14 2156 08/16/14 0009 08/16/14 0407 08/16/14 0743  GLUCAP 120* 145* 154* 149* 81    Recent Labs Lab 08/15/14 1906 08/15/14 1927 08/16/14 0531  NA 139 140 139  K 3.7 3.8 3.9  CL 104 101 105  CO2 26  --  28  GLUCOSE 143* 142* 117*  BUN 8 9 9   CREATININE 0.44 0.50 0.47  CALCIUM  9.3  --  9.0    Recent Labs Lab 08/15/14 1906 08/16/14 0531  AST 22 19  ALT 14 14  ALKPHOS 120 114  BILITOT 0.5 0.5  PROT 6.8 6.0*  ALBUMIN 3.9 3.4*    Recent Labs Lab 08/15/14 1906 08/15/14 1927 08/16/14 0531  WBC 4.7  --  5.0  NEUTROABS 2.4  --  1.8  HGB 13.8 14.3 13.0  HCT 40.4 42.0 38.5  MCV 90.8  --  91.2  PLT 200  --  182   No results for input(s): CKTOTAL, CKMB, CKMBINDEX, TROPONINI in the last 168 hours.  Recent Labs  08/15/14 1906  LABPROT 12.8  INR 0.95   No results for input(s): COLORURINE, LABSPEC, PHURINE, GLUCOSEU, HGBUR, BILIRUBINUR, KETONESUR, PROTEINUR, UROBILINOGEN, NITRITE, LEUKOCYTESUR in the last 72 hours.  Invalid input(s): APPERANCEUR     Component Value Date/Time   CHOL 217* 08/16/2014 0531   TRIG 104 08/16/2014 0531   HDL 43 08/16/2014 0531   CHOLHDL 5.0 08/16/2014 0531   VLDL 21 08/16/2014 0531   LDLCALC 153* 08/16/2014 0531   No results found for: HGBA1C No results found for: LABOPIA, COCAINSCRNUR, LABBENZ, AMPHETMU, THCU, LABBARB  No results for input(s): ETH in the last 168 hours.   Dg Chest 2 View 08/15/2014    No acute cardiopulmonary process.  Chronic bronchitic markings.    Mr Brain Wo Contrast 08/15/2014    Acute nonhemorrhagic subcentimeter infarct involving the LEFT thalamus. The  location of this abnormality would explain the patient's sensory symptoms.  No evidence for intracranial large vessel occlusion. The observed infarction likely represents a small vessel insult.  Moderately advanced atrophy for age with extensive chronic microvascular ischemic change.      Mr Maxine Glenn Head/brain Wo Cm 08/16/2014    Moderate mid to distal posterior cerebral artery luminal regularity, mild within the anterior circulation in a pattern suggesting atherosclerosis. No large vessel occlusion.    Carotid Doppler  There is 1-39% bilateral ICA stenosis. Vertebral artery flow is antegrade.    2D Echocardiogram   - Left ventricle: The cavity size  was normal. Wall thickness wasnormal. Systolic function was normal. The estimated ejectionfraction was in the range of 60% to 65%. Wall motion was normal;there were no regional wall motion abnormalities. Dopplerparameters are consistent with abnormal left ventricularrelaxation (grade 1 diastolic dysfunction). - Aortic valve: There was no stenosis. - Mitral valve: There was trivial regurgitation. - Right ventricle: The cavity size was normal. Systolic functionwas normal. - Tricuspid valve: Peak RV-RA gradient (S): 23 mm Hg. - Pulmonary arteries: PA peak pressure: 26 mm Hg (S). - Inferior vena cava: The vessel was normal in size. Therespirophasic diameter changes were in the normal range (>= 50%),consistent with normal central venous pressure. Impressions:  Normal LV size and systolic function, EF 60-65%. Normal RV sizeand systolic function. No significant valvular abnormalities.   PHYSICAL EXAM Pleasant frail middle aged lady not in distress. . Afebrile. Head is nontraumatic. Neck is supple without bruit.    Cardiac exam no murmur or gallop. Lungs are clear to auscultation. Distal pulses are well felt. Neurological Exam ;  Awake  Alert oriented x 3. Normal speech and language.eye movements full without nystagmus.fundi were not visualized. Vision acuity and fields appear normal. Hearing is normal. Palatal movements are normal. Face symmetric. Tongue midline. Normal strength, tone, reflexes and coordination. Normal sensation but subjective paresthesias on the right side of face and upper extremity only. Gait deferred.  ASSESSMENT/PLAN Joyce Steele. Steele is a 54 y.o. female with history of DM type 1, RA, smoking presenting with right facial numbness. She did not receive IV t-PA due to delay in arrival.    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 Pearlean Brownie in 2 months. Order written  If facial numbness does not resolve, can consider treatment with medication  Hypertension  Stable  Hyperlipidemia  Home meds:  No statin   LDL 153, goal < 70  Add statin - lipitor 40 mg daily  Continue statin at discharge  Diabetes, type I  Other Stroke Risk Factors  Cigarette smoker, advised to stop smoking  Family hx stroke (mother at age 32)  Other Active Problems  Rheumatoid arthritis  Hospital day # 1  [facility] See Amion for Pager information 08/16/2014 3:44 PM  I have personally examined this patient,  reviewed notes, independently viewed imaging studies, participated in medical decision making and plan of care. I have made any additions or clarifications directly to the above note. Agree with note above. She presented with sudden onset of right face and hand paresthesias from a small left thalamic lacunar infarct etiology likely small vessel disease. Patient remains at risk for neurological worsening, recurrent stroke and TIAs and needs ongoing stroke evaluation as well as aggressive risk factor modification. Recommend smoking cessation and patient counseled to do so. Start aspirin.  Delia Heady, MD Medical Director [facility] Pager: [phone] 08/16/2014 3:53 PM    To contact Stroke Continuity provider, please refer to [email or web address]. After hours, contact General Neurology

## 2014-08-16 NOTE — Progress Notes (Signed)
SLP Cancellation Note  Patient Details Name: Joyce Steele. Billing MRN: [id number] DOB: [birth year]   Cancelled treatment:        Chart reviewed.  MD notes indicate no ST needed at this time.  Brief screen reveals speech, language, and swallowing to be intact.  No f/u needs identified.  Further evaluation deferred (no charge).   Maryjo Rochester T 08/16/2014, 12:45 PM

## 2014-08-16 NOTE — Progress Notes (Signed)
PT Cancellation Note  Patient Details Name: Joyce Steele. [place] MRN: [id number] DOB: 1960-08-24   Cancelled Treatment:    Reason Eval/Treat Not Completed: Patient at procedure or test/unavailable Patient has been taken off unit for testing. Will follow up for PT evaluation as schedule allows.  Berton Mount 08/16/2014, 9:09 AM Charlsie Merles, PT [phone]

## 2014-08-16 NOTE — Progress Notes (Signed)
Patient arrived to 4N18 AAOx4 and able to transfer to the bed. Vitals taken, tele placed, call bell by her side, and bed alarm on. Pt and husband oriented to the room and questions answered. MD at bedside. Will continue to monitor. Joyce Steele, [place] Scrape

## 2014-08-16 NOTE — Progress Notes (Signed)
Discharge instructions reviewed with patient. All questions answered at this time. Patient waiting for family for transport.  Sim Boast, RN

## 2014-08-16 NOTE — Discharge Instructions (Signed)
Smoking Cessation Quitting smoking is important to your health and has many advantages. However, it is not always easy to quit since nicotine is a very addictive drug. Oftentimes, people try 3 times or more before being able to quit. This document explains the best ways for you to prepare to quit smoking. Quitting takes hard work and a lot of effort, but you can do it. ADVANTAGES OF QUITTING SMOKING  You will live longer, feel better, and live better.  Your body will feel the impact of quitting smoking almost immediately.  Within 20 minutes, blood pressure decreases. Your pulse returns to its normal level.  After 8 hours, carbon monoxide levels in the blood return to normal. Your oxygen level increases.  After 24 hours, the chance of having a heart attack starts to decrease. Your breath, hair, and body stop smelling like smoke.  After 48 hours, damaged nerve endings begin to recover. Your sense of taste and smell improve.  After 72 hours, the body is virtually free of nicotine. Your bronchial tubes relax and breathing becomes easier.  After 2 to 12 weeks, lungs can hold more air. Exercise becomes easier and circulation improves.  The risk of having a heart attack, stroke, cancer, or lung disease is greatly reduced.  After 1 year, the risk of coronary heart disease is cut in half.  After 5 years, the risk of stroke falls to the same as a nonsmoker.  After 10 years, the risk of lung cancer is cut in half and the risk of other cancers decreases significantly.  After 15 years, the risk of coronary heart disease drops, usually to the level of a nonsmoker.  If you are pregnant, quitting smoking will improve your chances of having a healthy baby.  The people you live with, especially any children, will be healthier.  You will have extra money to spend on things other than cigarettes. QUESTIONS TO THINK ABOUT BEFORE ATTEMPTING TO QUIT You may want to talk about your answers with your  health care provider.  Why do you want to quit?  If you tried to quit in the past, what helped and what did not?  What will be the most difficult situations for you after you quit? How will you plan to handle them?  Who can help you through the tough times? Your family? Friends? A health care provider?  What pleasures do you get from smoking? What ways can you still get pleasure if you quit? Here are some questions to ask your health care provider:  How can you help me to be successful at quitting?  What medicine do you think would be best for me and how should I take it?  What should I do if I need more help?  What is smoking withdrawal like? How can I get information on withdrawal? GET READY  Set a quit date.  Change your environment by getting rid of all cigarettes, ashtrays, matches, and lighters in your home, car, or work. Do not let people smoke in your home.  Review your past attempts to quit. Think about what worked and what did not. GET SUPPORT AND ENCOURAGEMENT You have a better chance of being successful if you have help. You can get support in many ways.  Tell your family, friends, and coworkers that you are going to quit and need their support. Ask them not to smoke around you.  Get individual, group, or telephone counseling and support. Programs are available at local hospitals and health centers. Call   your local health department for information about programs in your area.  Spiritual beliefs and practices may help some smokers quit.  Download a "quit meter" on your computer to keep track of quit statistics, such as how long you have gone without smoking, cigarettes not smoked, and money saved.  Get a self-help book about quitting smoking and staying off tobacco. LEARN NEW SKILLS AND BEHAVIORS  Distract yourself from urges to smoke. Talk to someone, go for a walk, or occupy your time with a task.  Change your normal routine. Take a different route to work.  Drink tea instead of coffee. Eat breakfast in a different place.  Reduce your stress. Take a hot bath, exercise, or read a book.  Plan something enjoyable to do every day. Reward yourself for not smoking.  Explore interactive web-based programs that specialize in helping you quit. GET MEDICINE AND USE IT CORRECTLY Medicines can help you stop smoking and decrease the urge to smoke. Combining medicine with the above behavioral methods and support can greatly increase your chances of successfully quitting smoking.  Nicotine replacement therapy helps deliver nicotine to your body without the negative effects and risks of smoking. Nicotine replacement therapy includes nicotine gum, lozenges, inhalers, nasal sprays, and skin patches. Some may be available over-the-counter and others require a prescription.  Antidepressant medicine helps people abstain from smoking, but how this works is unknown. This medicine is available by prescription.  Nicotinic receptor partial agonist medicine simulates the effect of nicotine in your brain. This medicine is available by prescription. Ask your health care provider for advice about which medicines to use and how to use them based on your health history. Your health care provider will tell you what side effects to look out for if you choose to be on a medicine or therapy. Carefully read the information on the package. Do not use any other product containing nicotine while using a nicotine replacement product.  RELAPSE OR DIFFICULT SITUATIONS Most relapses occur within the first 3 months after quitting. Do not be discouraged if you start smoking again. Remember, most people try several times before finally quitting. You may have symptoms of withdrawal because your body is used to nicotine. You may crave cigarettes, be irritable, feel very hungry, cough often, get headaches, or have difficulty concentrating. The withdrawal symptoms are only temporary. They are strongest  when you first quit, but they will go away within 10-14 days. To reduce the chances of relapse, try to:  Avoid drinking alcohol. Drinking lowers your chances of successfully quitting.  Reduce the amount of caffeine you consume. Once you quit smoking, the amount of caffeine in your body increases and can give you symptoms, such as a rapid heartbeat, sweating, and anxiety.  Avoid smokers because they can make you want to smoke.  Do not let weight gain distract you. Many smokers will gain weight when they quit, usually less than 10 pounds. Eat a healthy diet and stay active. You can always lose the weight gained after you quit.  Find ways to improve your mood other than smoking. FOR MORE INFORMATION  www.smokefree.gov  Document Released: 03/24/2001 Document Revised: 08/14/2013 Document Reviewed: 07/09/2011 Exit[employer] Patient Information 2015 [employer]. This information is not intended to replace advice given to you by your health care provider. Make sure you discuss any questions you have with your health care provider.  

## 2014-08-16 NOTE — Discharge Summary (Signed)
Physician Discharge Summary  Joyce Steele DOB: [birth year] DOA: 08/15/2014  PCP: No primary care provider on file.  Admit date: 08/15/2014 Discharge date: 08/16/2014  Time spent: 45 minutes  Recommendations for Outpatient Follow-up:  Patient will be discharged to home.  Patient will need to follow up with primary care provider within one week of discharge, as well as Dr. Pearlean Brownie, neurologist, in 1 month.  Patient should continue medications as prescribed.  Patient should follow a heart healthy/carb modified diet.  Resume activity as tolerated.    Discharge Diagnoses:  Acute CVA Diabetes mellitus, type I Tobacco abuse Rheumatoid arthritis Depression Hyperlipidemia  Discharge Condition: Stable  Diet recommendation: Heart healthy  Filed Weights   08/15/14 1832  Weight: 59.875 kg (132 lb)    History of present illness:  Joyce Steele is a 54 y.o. female with history of diabetes mellitus type 1 on insulin pump, ongoing tobacco abuse, rheumatoid arthritis presently on no medication started experiencing right facial numbness after waking up in the morning 9 AM. Patient's numbness was on the entire right face involving the scalp also. Patient also felt that she had some discoordination of her right upper extremity. Denies any visual symptoms difficulty talking swallowing or loss of function of upper or lower extremities. Patient had gone to her PCP when ordered MRI brain which showed left thalamic stroke and patient was referred to the ER. Patient otherwise denies any chest pain or shortness of breath nausea vomiting abdominal pain loss of consciousness palpitations.   Hospital Course:  Acute CVA -MRI brain: Acute nonhemorrhagic subcentimeter infarct involving left thalamus -Echocardiogram: EF 60-65%, grade 1 diastolic dysfunction, no significant valvular abnormalities -Carotid doppler: Bilateral: Intimal wall thickening CCA. Mixed mild plaque origin ICA. 1-39% ICA stenosis,  vertebral artery flows antegrade -LDL 153 -hemoglobin A1c pending-can be monitored and followed up by PCP  -PT consulted and recommended no further PT follow-up -Neurology consulted and appreciated, recommended follow-up with neurology in 1-2 months -Continue aspirin -Patient will be discharged on statin -Of note, patient still complains of right sided facial numbness and hand numbness, however, improved  Diabetes mellitus, type I -Continue insulin pump/home regimen -Hemoglobin A1c was 6.2 last month per patient  Tobacco abuse -Patient strongly advised to quit smoking  Rheumatoid arthritis -Patient currently on no home medications.  Depression -Continue Effexor  Hyperlipidemia -Lipid panel: TC 217, triglycerides 104, HDL 43, LDL 53 -Patient started on atorvastatin  Procedures: Echocardiogram Carotid Doppler  Consultations: Neurology  Discharge Exam: Filed Vitals:   08/16/14 1323  BP: 130/62  Pulse: 87  Temp: 98.2 F (36.8 C)  Resp: 16     General: Well developed, well nourished, NAD, appears stated age  HEENT: NCAT, PERRLA, EOMI, Anicteic Sclera, mucous membranes moist.  Cardiovascular: S1 S2 auscultated, no rubs, murmurs or gallops. Regular rate and rhythm.  Respiratory: Clear to auscultation bilaterally with equal chest rise  Abdomen: Soft, nontender, nondistended, + bowel sounds  Extremities: warm dry without cyanosis clubbing or edema  Neuro: AAOx3, cranial nerves grossly intact. Strength 5/5 in patient's upper and lower extremities bilaterally  Psych: Normal affect and demeanor with intact judgement and insight  Discharge Instructions      Discharge Instructions    Discharge instructions    Complete by:  As directed   Patient will be discharged to home.  Patient will need to follow up with primary care provider within one week of discharge, as well as Dr. Pearlean Brownie, neurologist, in 1 month.  Patient should continue medications  as prescribed.  Patient  should follow a heart healthy/carb modified diet.  Resume activity as tolerated.            Medication List    TAKE these medications        aspirin 325 MG tablet  Take 1 tablet (325 mg total) by mouth daily.     atorvastatin 40 MG tablet  Commonly known as:  LIPITOR  Take 1 tablet (40 mg total) by mouth daily at 6 PM.     insulin aspart 100 UNIT/ML injection  Commonly known as:  novoLOG  Inject 30 Units into the skin 3 (three) times daily before meals.     venlafaxine XR 75 MG 24 hr capsule  Commonly known as:  EFFEXOR-XR  Take 75 mg by mouth daily with breakfast.     zolpidem 12.5 MG CR tablet  Commonly known as:  AMBIEN CR  Take 12.5 mg by mouth at bedtime. Take every night per patient       No Known Allergies Follow-up Information    Follow up with SETHI,PRAMOD, MD.   Specialties:  Neurology, Radiology   Contact information:   [address] [place] [address] [phone]       Follow up with Julian Hy, MD. Schedule an appointment as soon as possible for a visit in 1 week.   Specialty:  Endocrinology   Why:  Hospital follow up   Contact information:   [address] [place] [address] [phone]        The results of significant diagnostics from this hospitalization (including imaging, microbiology, ancillary and laboratory) are listed below for reference.    Significant Diagnostic Studies: Dg Chest 2 View  08/15/2014   CLINICAL DATA:  Cerebral vascular accident. Right-sided facial numbness.  EXAM: CHEST  2 VIEW  COMPARISON:  Brain MRI 08/15/2014  FINDINGS: Normal cardiac silhouette. Mild bronchitic markings noted. No effusion, infiltrate, or pneumothorax.  IMPRESSION: No acute cardiopulmonary process.  Chronic bronchitic markings.   Electronically Signed   By: Genevive Bi M.D.   On: 08/15/2014 23:43   Mr Brain Wo Contrast  08/15/2014   CLINICAL DATA:  RIGHT-sided facial numbness extending to the RIGHT upper extremity  that began earlier today.  EXAM: MRI HEAD WITHOUT CONTRAST  TECHNIQUE: Multiplanar, multiecho pulse sequences of the brain and surrounding structures were obtained without intravenous contrast.  COMPARISON:  MR brain report 01/29/2012.  FINDINGS: There is a 5 x 8 mm area of restricted diffusion involving the LEFT thalamus consistent with an acute infarction. No other areas of restricted diffusion are seen.  No hemorrhage, mass lesion, or extra-axial fluid. Moderately advanced atrophy for age. Chronic microvascular ischemic change of a moderately extensive degree affects the periventricular and subcortical white matter, also the brainstem. No midline abnormalities.  Patent flow voids in the carotid, basilar, and vertebral arteries. No visible proximal large vessel occlusion intracranially, specifically with regard to the LEFT PCA.  Pituitary, pineal, and cerebellar tonsils unremarkable. No upper cervical lesions. Visualized calvarium, skull base, and upper cervical osseous structures unremarkable. Scalp and extracranial soft tissues, orbits, sinuses, and mastoids show no acute process.  Compared to the report from priors, atrophy and small vessel disease were described previously.  IMPRESSION: Acute nonhemorrhagic subcentimeter infarct involving the LEFT thalamus. The location of this abnormality would explain the patient's sensory symptoms.  No evidence for intracranial large vessel occlusion. The observed infarction likely represents a small vessel insult.  Moderately advanced atrophy for age with extensive  chronic microvascular ischemic change.  These results will be called to the ordering clinician or representative by the Radiologist Assistant, and communication documented in the PACS or zVision Dashboard.   Electronically Signed   By: Davonna Belling M.D.   On: 08/15/2014 16:14   Mr Maxine Glenn Head/brain Wo Cm  08/16/2014   CLINICAL DATA:  Awoke with RIGHT facial numbness, follow-up acute LEFT lacunar infarct. History  of diabetes, rheumatoid arthritis.  EXAM: MRA HEAD WITHOUT CONTRAST  TECHNIQUE: Angiographic images of the Circle of Willis were obtained using MRA technique without intravenous contrast.  COMPARISON:  MRI of the brain June 15, 2014 at 1536 hours  FINDINGS: Anterior circulation: Normal flow related enhancement of the included cervical, petrous, cavernous and supra clinoid internal carotid arteries. Patent anterior communicating artery. Normal flow related enhancement of the anterior and middle cerebral arteries, including more distal segments. Mild luminal irregularity of the LEFT middle cerebral artery mid to distal segments.  No large vessel occlusion, high-grade stenosis, aneurysm.  Posterior circulation: Codominant vertebral arteries. Basilar artery is patent, with normal flow related enhancement of the main branch vessels. LEFT P3 segment occlusion. Moderate luminal irregularity of the mid to distal bilateral posterior cerebral artery segments. Otherwise normal flow related enhancement of the posterior cerebral arteries.  No large vessel occlusion, high-grade stenosis, aneurysm.  IMPRESSION: Moderate mid to distal posterior cerebral artery luminal regularity, mild within the anterior circulation in a pattern suggesting atherosclerosis. No large vessel occlusion.   Electronically Signed   By: Awilda Metro   On: 08/16/2014 00:11    Microbiology: No results found for this or any previous visit (from the past 240 hour(s)).   Labs: Basic Metabolic Panel:  Recent Labs Lab 08/15/14 1906 08/15/14 1927 08/16/14 0531  NA 139 140 139  K 3.7 3.8 3.9  CL 104 101 105  CO2 26  --  28  GLUCOSE 143* 142* 117*  BUN 8 9 9   CREATININE 0.44 0.50 0.47  CALCIUM 9.3  --  9.0   Liver Function Tests:  Recent Labs Lab 08/15/14 1906 08/16/14 0531  AST 22 19  ALT 14 14  ALKPHOS 120 114  BILITOT 0.5 0.5  PROT 6.8 6.0*  ALBUMIN 3.9 3.4*   No results for input(s): LIPASE, AMYLASE in the last 168  hours. No results for input(s): AMMONIA in the last 168 hours. CBC:  Recent Labs Lab 08/15/14 1906 08/15/14 1927 08/16/14 0531  WBC 4.7  --  5.0  NEUTROABS 2.4  --  1.8  HGB 13.8 14.3 13.0  HCT 40.4 42.0 38.5  MCV 90.8  --  91.2  PLT 200  --  182   Cardiac Enzymes: No results for input(s): CKTOTAL, CKMB, CKMBINDEX, TROPONINI in the last 168 hours. BNP: BNP (last 3 results) No results for input(s): BNP in the last 8760 hours.  ProBNP (last 3 results) No results for input(s): PROBNP in the last 8760 hours.  CBG:  Recent Labs Lab 08/15/14 2156 08/16/14 0009 08/16/14 0407 08/16/14 0743 08/16/14 1132  GLUCAP 145* 154* 149* 81 189*       Signed:  Vrishank Moster  [facility] 08/16/2014, 2:15 PM

## 2014-08-16 NOTE — Progress Notes (Signed)
Pump Settings:  Basal Rates 1200 am .3 units/hr  0100 am .2 units/hr 0500 am .4 units/hr 0700 am .6 units/hr 1200 pm .2 units/hr 1500 pm .4 units/hr  Sensitivity: 70, Carb ratio - 1:15, Target glucose 120 mg/dl  Christena Deem RN, MSN, [facility] Inpatient Diabetes Coordinator Team Pager [phone]

## 2014-08-16 NOTE — Care Management Note (Signed)
Case Management Note  Patient Details  Name: Joyce Steele. Joyce Steele MRN: [id number] Date of Birth: [birth year]  Subjective/Objective:        ADMITTED WITH STROKE            Action/Plan: CM FOLLOWING FOR DCP; AWAITING FOR PT/OT EVALS FOR DISPOSITION NEEDS  Expected Discharge Date:       08/20/2014           Expected Discharge Plan:   POSSIBLY   In-House Referral:   NA  Status of Service:   IN PROGRESS  Additional Comments:  Reola Mosher [phone] 08/16/2014, 10:19 AM

## 2014-08-16 NOTE — Progress Notes (Signed)
  Echocardiogram 2D Echocardiogram has been performed.  Joyce Steele 08/16/2014, 9:47 AM

## 2014-08-16 NOTE — Evaluation (Addendum)
Physical Therapy Evaluation and Discharge Patient Details Name: Joyce Steele. Joyce Steele MRN: [id number] DOB: February 26, 1961 Today's Date: 08/16/2014   History of Present Illness  Joyce Steele is a 54 y.o. female with history of diabetes mellitus type 1 on insulin pump, ongoing tobacco abuse, rheumatoid arthritis presently on no medication started experiencing right facial numbness. MRI with evidence of an acute left lacunar infarct.  Clinical Impression  Patient evaluated by Physical Therapy with no further acute PT needs identified. All education has been completed and the patient has no further questions. Ambulates well without loss of balance during challenging dynamic gait tasks. No difficulty with stairs. Strength grossly 5/5 throughout UEs and LEs. Continues to report numbness of Rt hand and Rt side of face. See below for any follow-up Physial Therapy or equipment needs. PT is signing off. Thank you for this referral.     Follow Up Recommendations No PT follow up    Equipment Recommendations  None recommended by PT    Recommendations for Other Services       Precautions / Restrictions Precautions Precautions: None Restrictions Weight Bearing Restrictions: No      Mobility  Bed Mobility Overal bed mobility: Independent                Transfers Overall transfer level: Modified independent                  Ambulation/Gait Ambulation/Gait assistance: Modified independent (Device/Increase time) Ambulation Distance (Feet): 525 Feet Assistive device: None Gait Pattern/deviations: Step-through pattern   Gait velocity interpretation: at or above normal speed for age/gender General Gait Details: Tolerated dynamic gait challenges including high marching, quick turns, variable speeds, and backwards stepping. All performed without loss of balance. denies dizziness.  Stairs Stairs: Yes Stairs assistance: Modified independent (Device/Increase time) Stair Management: One rail  Right;Alternating pattern;Forwards Number of Stairs: 2 (x2) General stair comments: Completed without cues or need for assistance.  Wheelchair Mobility    Modified Rankin (Stroke Patients Only) Modified Rankin (Stroke Patients Only) Pre-Morbid Rankin Score: No symptoms Modified Rankin: No significant disability     Balance Overall balance assessment: Independent                                           Pertinent Vitals/Pain Pain Assessment: 0-10 Pain Score: 3  Pain Location: frontal headache Pain Descriptors / Indicators: Aching Pain Intervention(s): Repositioned;Monitored during session    Home Living Family/patient expects to be discharged to:: Private residence Living Arrangements: Spouse/significant other Available Help at Discharge: Family;Available 24 hours/day Type of Home: House Home Access: Stairs to enter Entrance Stairs-Rails: Right Entrance Stairs-Number of Steps: 4 Home Layout: One level Home Equipment: None      Prior Function Level of Independence: Independent               Hand Dominance   Dominant Hand: Right    Extremity/Trunk Assessment   Upper Extremity Assessment: Defer to OT evaluation           Lower Extremity Assessment: Overall WFL for tasks assessed         Communication   Communication: No difficulties  Cognition Arousal/Alertness: Awake/alert Behavior During Therapy: WFL for tasks assessed/performed Overall Cognitive Status: Within Functional Limits for tasks assessed                      General Comments  General comments (skin integrity, edema, etc.): Reports numbness of Rt hand and face. Normal strength of UEs and LEs grossly throughout.    Exercises        Assessment/Plan    PT Assessment Patent does not need any further PT services  PT Diagnosis Acute pain   PT Problem List    PT Treatment Interventions     PT Goals (Current goals can be found in the Care Plan section)  Acute Rehab PT Goals Patient Stated Goal: go home PT Goal Formulation: All assessment and education complete, DC therapy    Frequency     Barriers to discharge        Co-evaluation               End of Session   Activity Tolerance: Patient tolerated treatment well;No increased pain Patient left: in bed;with call bell/phone within reach;with family/visitor present Nurse Communication: Mobility status         Time: 4174-0814 PT Time Calculation (min) (ACUTE ONLY): 12 min   Charges:   PT Evaluation $Initial PT Evaluation Tier I: 1 Procedure     PT G CodesBerton Mount 08/16/2014, 10:37 AM Charlsie Merles, PT [phone]

## 2014-08-17 LAB — HEMOGLOBIN A1C
Hgb A1c MFr Bld: 6.9 % — ABNORMAL HIGH (ref 4.8–5.6)
Mean Plasma Glucose: 151 mg/dL

## 2014-11-16 ENCOUNTER — Ambulatory Visit: Payer: Self-pay | Admitting: Neurology

## 2015-01-09 ENCOUNTER — Ambulatory Visit: Payer: Self-pay | Admitting: Neurology

## 2015-02-05 ENCOUNTER — Ambulatory Visit (INDEPENDENT_AMBULATORY_CARE_PROVIDER_SITE_OTHER): Payer: Medicare Other | Admitting: Neurology

## 2015-02-05 ENCOUNTER — Encounter: Payer: Self-pay | Admitting: Neurology

## 2015-02-05 VITALS — BP 132/79 | HR 99 | Ht 64.0 in | Wt 131.4 lb

## 2015-02-05 DIAGNOSIS — I639 Cerebral infarction, unspecified: Secondary | ICD-10-CM

## 2015-02-05 DIAGNOSIS — I6381 Other cerebral infarction due to occlusion or stenosis of small artery: Secondary | ICD-10-CM

## 2015-02-05 NOTE — Patient Instructions (Signed)
I had a long d/w patient about her recent stroke, risk for recurrent stroke/TIAs, personally independently reviewed imaging studies and stroke evaluation results and answered questions.Continue aspirin 325 mg daily  for secondary stroke prevention and maintain strict control of hypertension with blood pressure goal below 130/90, diabetes with hemoglobin A1c goal below 6.5% and lipids with LDL cholesterol goal below 100 mg/dL. I also advised the patient to eat a healthy diet with plenty of whole grains, cereals, fruits and vegetables, exercise regularly and maintain ideal body weight Followup in the future with me in  6 months or call earlier if necessary Stroke Prevention Some medical conditions and behaviors are associated with an increased chance of having a stroke. You may prevent a stroke by making healthy choices and managing medical conditions. HOW CAN I REDUCE MY RISK OF HAVING A STROKE?   Stay physically active. Get at least 30 minutes of activity on most or all days.  Do not smoke. It may also be helpful to avoid exposure to secondhand smoke.  Limit alcohol use. Moderate alcohol use is considered to be:  No more than 2 drinks per day for men.  No more than 1 drink per day for nonpregnant women.  Eat healthy foods. This involves:  Eating 5 or more servings of fruits and vegetables a day.  Making dietary changes that address high blood pressure (hypertension), high cholesterol, diabetes, or obesity.  Manage your cholesterol levels.  Making food choices that are high in fiber and low in saturated fat, trans fat, and cholesterol may control cholesterol levels.  Take any prescribed medicines to control cholesterol as directed by your health care provider.  Manage your diabetes.  Controlling your carbohydrate and sugar intake is recommended to manage diabetes.  Take any prescribed medicines to control diabetes as directed by your health care provider.  Control your  hypertension.  Making food choices that are low in salt (sodium), saturated fat, trans fat, and cholesterol is recommended to manage hypertension.  Ask your health care provider if you need treatment to lower your blood pressure. Take any prescribed medicines to control hypertension as directed by your health care provider.  If you are 17-70 years of age, have your blood pressure checked every 3-5 years. If you are 66 years of age or older, have your blood pressure checked every year.  Maintain a healthy weight.  Reducing calorie intake and making food choices that are low in sodium, saturated fat, trans fat, and cholesterol are recommended to manage weight.  Stop drug abuse.  Avoid taking birth control pills.  Talk to your health care provider about the risks of taking birth control pills if you are over 75 years old, smoke, get migraines, or have ever had a blood clot.  Get evaluated for sleep disorders (sleep apnea).  Talk to your health care provider about getting a sleep evaluation if you snore a lot or have excessive sleepiness.  Take medicines only as directed by your health care provider.  For some people, aspirin or blood thinners (anticoagulants) are helpful in reducing the risk of forming abnormal blood clots that can lead to stroke. If you have the irregular heart rhythm of atrial fibrillation, you should be on a blood thinner unless there is a good reason you cannot take them.  Understand all your medicine instructions.  Make sure that other conditions (such as anemia or atherosclerosis) are addressed. SEEK IMMEDIATE MEDICAL CARE IF:   You have sudden weakness or numbness of the face, arm,  or leg, especially on one side of the body.  Your face or eyelid droops to one side.  You have sudden confusion.  You have trouble speaking (aphasia) or understanding.  You have sudden trouble seeing in one or both eyes.  You have sudden trouble walking.  You have  dizziness.  You have a loss of balance or coordination.  You have a sudden, severe headache with no known cause.  You have new chest pain or an irregular heartbeat. Any of these symptoms may represent a serious problem that is an emergency. Do not wait to see if the symptoms will go away. Get medical help at once. Call your local emergency services (911 in [place] Do not drive yourself to the hospital.   This information is not intended to replace advice given to you by your health care provider. Make sure you discuss any questions you have with your health care provider.   Document Released: 05/07/2004 Document Revised: 04/20/2014 Document Reviewed: 09/30/2012 Elsevier Interactive Patient Education [employer].

## 2015-02-05 NOTE — Progress Notes (Signed)
[facility] [address] [place]. [place] [address] [phone]       OFFICE FOLLOW-UP NOTE  [name] Liv Date of Birth:  1961-01-16 Medical Record Number:  [id number]   HPI:  Joyce Steele. Joyce Steele is an 54 y.o. female, right handed, with a past medical history significant for DM type 1, RA, smoking, admitted to [facility] for further evaluation of numbness right face-arm. Stated that she never had similar symptoms before. Went to bed 08/14/14 night feeling well but woke up around 8:45 next morning with a numb sensation involving her right face including right side of her lips and tongue as well as the right arm. (LKW 08/14/2014, time unknown). She denied associated HA, vertigo, double vision, difficulty swallowing, focal weakness, unsteadiness, confusion, slurred speech, language or vision impairment. She indicated that her symptoms did not get better and drove to her endocrinologist office who sent her to [facility] for urgent MRI brain. I did review her brain MRI and there is evidence of an acute left lacunar infarct. [name] said that her symptoms remain unchanged. Patient was not administered TPA secondary to delay in arrival. She was admitted for further evaluation and treatment. MRI scan of the brain showed acute nonhemorrhagic subcentimeter infarct involving the left thalamus which could explain her sensory symptoms. MRA of the brain showed moderate mid to distal posterior cerebral artery irregularity but no large vessel stenosis. Carotid ultrasound showed no significant stenosis transversely echo showed normal ejection fraction. LDL cholesterol is elevated and 153 and hemoglobin A1c at 6.9. Patient was started on Lipitor 40 Grams daily and aspirin as well as blood pressure medications. She states she's done well since discharge and numbness on the right side of the face as well as hand is reduced but it is still present. She is at a flareup and arthritis. She does not work as she is on  disability from her rheumatoid arthritis. She had repeat lipid profile checked by her primary physician and it was fine last month. She remains on aspirin which is tolerating well with only minor bruising and no bleeding episodes. She has no new neurological complaints. ROS:   14 system review of systems is positive for  tingling, numbness, insomnia, joint pain, aching muscles, depression, environmental allergies, eye itching, rashes and moles and all other systems negative  PMH:  Past Medical History  Diagnosis Date  . Arthritis   . Diabetes mellitus without complication (HCC)   . Stroke (HCC)   . Rheumatoid arthritis (HCC)     Social History:  Social History   Social History  . Marital Status: Legally Separated    Spouse Name: N/A  . Number of Children: N/A  . Years of Education: N/A   Occupational History  . Not on file.   Social History Main Topics  . Smoking status: Current Every Day Smoker -- 0.25 packs/day  . Smokeless tobacco: Not on file  . Alcohol Use: No  . Drug Use: Not on file  . Sexual Activity: Not on file   Other Topics Concern  . Not on file   Social History Narrative    Medications:   Current Outpatient Prescriptions on File Prior to Visit  Medication Sig Dispense Refill  . aspirin 325 MG tablet Take 1 tablet (325 mg total) by mouth daily. 30 tablet 0  . atorvastatin (LIPITOR) 40 MG tablet Take 1 tablet (40 mg total) by mouth daily at 6 PM. 30 tablet 0  . insulin aspart (NOVOLOG) 100 UNIT/ML  injection Inject 30 Units into the skin 3 (three) times daily before meals.    . venlafaxine XR (EFFEXOR-XR) 75 MG 24 hr capsule Take 75 mg by mouth daily with breakfast.    . zolpidem (AMBIEN CR) 12.5 MG CR tablet Take 12.5 mg by mouth at bedtime. Take every night per patient     No current facility-administered medications on file prior to visit.    Allergies:  No Known Allergies  Physical Exam General: well developed, well nourished, seated, in no evident  distress Head: head normocephalic and atraumatic.  Neck: supple with no carotid or supraclavicular bruits Cardiovascular: regular rate and rhythm, no murmurs Musculoskeletal: no deformity Skin:  no rash/petichiae Vascular:  Normal pulses all extremities Filed Vitals:   02/05/15 1516  BP: 132/79  Pulse: 99   Neurologic Exam Mental Status: Awake and fully alert. Oriented to place and time. Recent and remote memory intact. Attention span, concentration and fund of knowledge appropriate. Mood and affect appropriate.  Cranial Nerves: Fundoscopic exam reveals sharp disc margins. Pupils equal, briskly reactive to light. Extraocular movements full without nystagmus. Visual fields full to confrontation. Hearing intact. Facial sensation intact. Face, tongue, palate moves normally and symmetrically.  Motor: Normal bulk and tone. Normal strength in all tested extremity muscles. Sensory.: intact to touch ,pinprick .position and vibratory sensation.  Coordination: Rapid alternating movements normal in all extremities. Finger-to-nose and heel-to-shin performed accurately bilaterally. Mild paresthesias in the right corner of the mouth and right hand medial 2 fingers but no objective sensory loss. Gait and Station: Arises from chair without difficulty. Stance is normal. Gait demonstrates normal stride length and balance . Able to heel, toe and tandem walk without difficulty.  Reflexes: 1+ and symmetric. Toes downgoing.   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    PLAN: I had a long d/w patient about her recent stroke, risk for recurrent stroke/TIAs, personally independently reviewed imaging studies and stroke evaluation results and answered questions.Continue aspirin 325 mg daily  for secondary stroke prevention and maintain strict  control of hypertension with blood pressure goal below 130/90, diabetes with hemoglobin A1c goal below 6.5% and lipids with LDL cholesterol goal below 100 mg/dL. I also advised the patient to eat a healthy diet with plenty of whole grains, cereals, fruits and vegetables, exercise regularly and maintain ideal body weight Greater than 50% of time during this 25 minute visit was spent on counseling,explanation of diagnosis, planning of further management, discussion with patient  and coordination of care .Followup in the future with me in  6 months or call earlier if necessary   Delia Heady, MD  Note: This document was prepared with digital dictation and possible smart phrase technology. Any transcriptional errors that result from this process are unintentional

## 2015-02-06 ENCOUNTER — Telehealth: Payer: Self-pay | Admitting: Neurology

## 2015-02-06 ENCOUNTER — Other Ambulatory Visit: Payer: Self-pay

## 2015-02-06 NOTE — Telephone Encounter (Signed)
Rn return patients phone call about medication for arthritis she forgot. Pt gave name Leflunomide 20mg  daily. Med added to list.

## 2015-02-06 NOTE — Telephone Encounter (Signed)
Patient called back with the name of the medication that she couldn't remember at visit yesterday. The medication is Leflunomide 20mg  1 tablet daily.

## 2015-04-19 DIAGNOSIS — E784 Other hyperlipidemia: Secondary | ICD-10-CM | POA: Diagnosis not present

## 2015-04-19 DIAGNOSIS — E109 Type 1 diabetes mellitus without complications: Secondary | ICD-10-CM | POA: Diagnosis not present

## 2015-04-19 DIAGNOSIS — M069 Rheumatoid arthritis, unspecified: Secondary | ICD-10-CM | POA: Diagnosis not present

## 2015-04-19 DIAGNOSIS — E559 Vitamin D deficiency, unspecified: Secondary | ICD-10-CM | POA: Diagnosis not present

## 2015-04-19 DIAGNOSIS — M859 Disorder of bone density and structure, unspecified: Secondary | ICD-10-CM | POA: Diagnosis not present

## 2015-04-19 DIAGNOSIS — Z6821 Body mass index (BMI) 21.0-21.9, adult: Secondary | ICD-10-CM | POA: Diagnosis not present

## 2015-04-19 DIAGNOSIS — I639 Cerebral infarction, unspecified: Secondary | ICD-10-CM | POA: Diagnosis not present

## 2015-04-19 DIAGNOSIS — L309 Dermatitis, unspecified: Secondary | ICD-10-CM | POA: Diagnosis not present

## 2015-04-29 DIAGNOSIS — Z1212 Encounter for screening for malignant neoplasm of rectum: Secondary | ICD-10-CM | POA: Diagnosis not present

## 2015-07-15 DIAGNOSIS — I779 Disorder of arteries and arterioles, unspecified: Secondary | ICD-10-CM | POA: Diagnosis not present

## 2015-07-15 DIAGNOSIS — E785 Hyperlipidemia, unspecified: Secondary | ICD-10-CM | POA: Diagnosis not present

## 2015-07-15 DIAGNOSIS — R197 Diarrhea, unspecified: Secondary | ICD-10-CM | POA: Diagnosis not present

## 2015-07-15 DIAGNOSIS — E109 Type 1 diabetes mellitus without complications: Secondary | ICD-10-CM | POA: Diagnosis not present

## 2015-07-15 DIAGNOSIS — Z6821 Body mass index (BMI) 21.0-21.9, adult: Secondary | ICD-10-CM | POA: Diagnosis not present

## 2015-07-15 DIAGNOSIS — F418 Other specified anxiety disorders: Secondary | ICD-10-CM | POA: Diagnosis not present

## 2015-07-15 DIAGNOSIS — I639 Cerebral infarction, unspecified: Secondary | ICD-10-CM | POA: Diagnosis not present

## 2015-07-15 DIAGNOSIS — M8580 Other specified disorders of bone density and structure, unspecified site: Secondary | ICD-10-CM | POA: Diagnosis not present

## 2015-07-15 DIAGNOSIS — Z1389 Encounter for screening for other disorder: Secondary | ICD-10-CM | POA: Diagnosis not present

## 2015-07-15 DIAGNOSIS — E559 Vitamin D deficiency, unspecified: Secondary | ICD-10-CM | POA: Diagnosis not present

## 2015-07-15 DIAGNOSIS — I638 Other cerebral infarction: Secondary | ICD-10-CM | POA: Diagnosis not present

## 2015-07-15 DIAGNOSIS — M069 Rheumatoid arthritis, unspecified: Secondary | ICD-10-CM | POA: Diagnosis not present

## 2015-08-06 ENCOUNTER — Ambulatory Visit (INDEPENDENT_AMBULATORY_CARE_PROVIDER_SITE_OTHER): Payer: PPO | Admitting: Neurology

## 2015-08-06 ENCOUNTER — Encounter: Payer: Self-pay | Admitting: Neurology

## 2015-08-06 VITALS — BP 129/81 | HR 90 | Ht 64.0 in | Wt 124.4 lb

## 2015-08-06 DIAGNOSIS — M5010 Cervical disc disorder with radiculopathy, unspecified cervical region: Secondary | ICD-10-CM

## 2015-08-06 MED ORDER — PREGABALIN 50 MG PO CAPS: 50.0000 mg | ORAL_CAPSULE | Freq: Two times a day (BID) | ORAL | Status: DC

## 2015-08-06 MED ORDER — ASPIRIN 81 MG PO TABS: 81.0000 mg | ORAL_TABLET | Freq: Every day | ORAL | Status: DC

## 2015-08-06 NOTE — Patient Instructions (Signed)
I had a long d/w patient about her remote thalamic infarct, risk for recurrent stroke/TIAs, personally independently reviewed imaging studies and stroke evaluation results and answered questions.I recommend she reduce the dose of aspirin to 81 mg daily because of increased bruising for secondary stroke prevention and maintain strict control of hypertension with blood pressure goal below 130/90, diabetes with hemoglobin A1c goal below 6.5% and lipids with LDL cholesterol goal below 70 mg/dL. I also advised the patient to eat a healthy diet with plenty of whole grains, cereals, fruits and vegetables, exercise regularly and maintain ideal body weight .she was also prescribed Lyrica 50 mg twice daily to help with her cervical radicular pain which appears to be chronic. She has had extensive evaluation for this in the past and feels surgery. I discussed possible side effects with the patient and advised to call me if needed. Followup in the future with me in one year or call earlier if necessary Cervical Radiculopathy Cervical radiculopathy happens when a nerve in the neck (cervical nerve) is pinched or bruised. This condition can develop because of an injury or as part of the normal aging process. Pressure on the cervical nerves can cause pain or numbness that runs from the neck all the way down into the arm and fingers. Usually, this condition gets better with rest. Treatment may be needed if the condition does not improve.  CAUSES This condition may be caused by:  Injury.  Slipped (herniated) disk.  Muscle tightness in the neck because of overuse.  Arthritis.  Breakdown or degeneration in the bones and joints of the spine (spondylosis) due to aging.  Bone spurs that may develop near the cervical nerves. SYMPTOMS Symptoms of this condition include:  Pain that runs from the neck to the arm and hand. The pain can be severe or irritating. It may be worse when the neck is moved.  Numbness or weakness  in the affected arm and hand. DIAGNOSIS This condition may be diagnosed based on symptoms, medical history, and a physical exam. You may also have tests, including:  X-rays.  CT scan.  MRI.  Electromyogram (EMG).  Nerve conduction tests. TREATMENT In many cases, treatment is not needed for this condition. With rest, the condition usually gets better over time. If treatment is needed, options may include:  Wearing a soft neck collar for short periods of time.  Physical therapy to strengthen your neck muscles.  Medicines, such as NSAIDs, oral corticosteroids, or spinal injections.  Surgery. This may be needed if other treatments do not help. Various types of surgery may be done depending on the cause of your problems. HOME CARE INSTRUCTIONS Managing Pain  Take over-the-counter and prescription medicines only as told by your health care provider.  If directed, apply ice to the affected area.  Put ice in a plastic bag.  Place a towel between your skin and the bag.  Leave the ice on for 20 minutes, 2-3 times per day.  If ice does not help, you can try using heat. Take a warm shower or warm bath, or use a heat pack as told by your health care provider.  Try a gentle neck and shoulder massage to help relieve symptoms. Activity  Rest as needed. Follow instructions from your health care provider about any restrictions on activities.  Do stretching and strengthening exercises as told by your health care provider or physical therapist. General Instructions  If you were given a soft collar, wear it as told by your health care  provider.  Use a flat pillow when you sleep.  Keep all follow-up visits as told by your health care provider. This is important. SEEK MEDICAL CARE IF:  Your condition does not improve with treatment. SEEK IMMEDIATE MEDICAL CARE IF:  Your pain gets much worse and cannot be controlled with medicines.  You have weakness or numbness in your hand, arm,  face, or leg.  You have a high fever.  You have a stiff, rigid neck.  You lose control of your bowels or your bladder (have incontinence).  You have trouble with walking, balance, or speaking.   This information is not intended to replace advice given to you by your health care provider. Make sure you discuss any questions you have with your health care provider.   Document Released: 12/23/2000 Document Revised: 12/19/2014 Document Reviewed: 05/24/2014 Elsevier Interactive Patient Education [employer]

## 2015-08-07 NOTE — Progress Notes (Signed)
[facility] [address] [place]. [place] [address] [phone]       OFFICE FOLLOW-UP NOTE  [name] Date of Birth:  05-03-1960 Medical Record Number:  [id number]   HPI:  Joyce Steele is an 55 y.o. female, right handed, with a past medical history significant for DM type 1, RA, smoking, admitted to [facility] for further evaluation of numbness right face-arm. Stated that she never had similar symptoms before. Went to bed 08/14/14 night feeling well but woke up around 8:45 next morning with a numb sensation involving her right face including right side of her lips and tongue as well as the right arm. (LKW 08/14/2014, time unknown). She denied associated HA, vertigo, double vision, difficulty swallowing, focal weakness, unsteadiness, confusion, slurred speech, language or vision impairment. She indicated that her symptoms did not get better and drove to her endocrinologist office who sent her to [facility] for urgent MRI brain. I did review her brain MRI and there is evidence of an acute left lacunar infarct. [name] said that her symptoms remain unchanged. Patient was not administered TPA secondary to delay in arrival. She was admitted for further evaluation and treatment. MRI scan of the brain showed acute nonhemorrhagic subcentimeter infarct involving the left thalamus which could explain her sensory symptoms. MRA of the brain showed moderate mid to distal posterior cerebral artery irregularity but no large vessel stenosis. Carotid ultrasound showed no significant stenosis transversely echo showed normal ejection fraction. LDL cholesterol is elevated and 153 and hemoglobin A1c at 6.9. Patient was started on Lipitor 40 Grams daily and aspirin as well as blood pressure medications. She states she's done well since discharge and numbness on the right side of the face as well as hand is reduced but it is still present. She is at a flareup and arthritis. She does not work as she is on  disability from her rheumatoid arthritis. She had repeat lipid profile checked by her primary physician and it was fine last month. She remains on aspirin which is tolerating well with only minor bruising and no bleeding episodes. She has no new neurological complaints. Update 08/06/2015 : She returns for follow-up after last visit 6 months ago. He is doing well from the stroke standpoint without recurrent stroke or TIAs. She still has some numbness around the right thumb which is not bothersome. She is remains on aspirin but does complain of bruising in her legs. She has had no bleeding problems. She is tolerating Lipitor well without any myalgias or arthralgias. She states her blood pressure is well controlled on metoprolol and today it is 129/81 office. She states her rheumatoid arthritis also appears to be quite well controlled and she sees Dr. Dierdre Forth for that. She has a new complaint today of right lateral neck pain which radiates down into her right thumb. This is a chronic problem upcoming for years and occurs daily. Neck movements with a right painful. At times she has trouble sleeping because of the pain. She has had extensive evaluation for this 5 years ago including x-rays and MRI scan. She was told she had a bulging disc and she saw Dr. Magnus Ivan from orthopedics .She refused surgery and had epidural spinal injections but without relief. She is currently not taking any medication for this particular pain though in the past she has tried gabapentin which has not worked well. ROS:   14 system review of systems is positive for  tingling, numbness,  weight change, shortness of breath,  eye itching, diarrhea, insomnia, frequency of urination, joint pain, skin rash, easy bruising, anxiety, depression and all other systems negative  PMH:  Past Medical History  Diagnosis Date  . Arthritis   . Diabetes mellitus without complication (HCC)   . Stroke (HCC)   . Rheumatoid arthritis (HCC)     Social  History:  Social History   Social History  . Marital Status: Legally Separated    Spouse Name: N/A  . Number of Children: N/A  . Years of Education: N/A   Occupational History  . Not on file.   Social History Main Topics  . Smoking status: Current Every Day Smoker -- 0.25 packs/day  . Smokeless tobacco: Not on file     Comment: pt smokes 4 cigarettes per day  . Alcohol Use: No  . Drug Use: Not on file  . Sexual Activity: Not on file   Other Topics Concern  . Not on file   Social History Narrative    Medications:   Current Outpatient Prescriptions on File Prior to Visit  Medication Sig Dispense Refill  . atorvastatin (LIPITOR) 40 MG tablet Take 1 tablet (40 mg total) by mouth daily at 6 PM. 30 tablet 0  . insulin aspart (NOVOLOG) 100 UNIT/ML injection Inject 30 Units into the skin 3 (three) times daily before meals.    Marland Kitchen leflunomide (ARAVA) 20 MG tablet Take 20 mg by mouth daily.    Marland Kitchen venlafaxine XR (EFFEXOR-XR) 75 MG 24 hr capsule Take 75 mg by mouth daily with breakfast.    . zolpidem (AMBIEN CR) 12.5 MG CR tablet Take 12.5 mg by mouth at bedtime. Take every night per patient     No current facility-administered medications on file prior to visit.    Allergies:  No Known Allergies  Physical Exam General: well developed, well nourished, seated, in no evident distress Head: head normocephalic and atraumatic.  Neck: supple with no carotid or supraclavicular bruits Cardiovascular: regular rate and rhythm, no murmurs Musculoskeletal: no deformity Skin:  no rash/petichiae Vascular:  Normal pulses all extremities Filed Vitals:   08/06/15 1501  BP: 129/81  Pulse: 90   Neurologic Exam Mental Status: Awake and fully alert. Oriented to place and time. Recent and remote memory intact. Attention span, concentration and fund of knowledge appropriate. Mood and affect appropriate.  Cranial Nerves: Fundoscopic exam reveals sharp disc margins. Pupils equal, briskly reactive to  light. Extraocular movements full without nystagmus. Visual fields full to confrontation. Hearing intact. Facial sensation intact. Face, tongue, palate moves normally and symmetrically.  Motor: Normal bulk and tone. Normal strength in all tested extremity muscles.No focal weakness in the upper extremity Sensory.: intact to touch ,pinprick .position and vibratory sensation. Mild subjective restoration as around the right thumb but no objective sensory loss Coordination: Rapid alternating movements normal in all extremities. Finger-to-nose and heel-to-shin performed accurately bilaterally. Mild paresthesias in the right corner of the mouth and right hand medial 2 fingers but no objective sensory loss. Gait and Station: Arises from chair without difficulty. Stance is normal. Gait demonstrates normal stride length and balance . Able to heel, toe and tandem walk without difficulty.  Reflexes: 1+ and symmetric. Toes downgoing.       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 2016 secondary to small vessel disease with vascular risk factors of hypertension, diabetes and hyperlipidemia.New right arm pain and paresthesias likely due to  Right sided C 5-6 cervical radiculopathy which is chronic    PLAN:  I had a long d/w patient about her remote thalamic infarct, risk for recurrent stroke/TIAs, personally independently reviewed imaging studies and stroke evaluation results and answered questions.I recommend she reduce the dose of aspirin to 81 mg daily because of increased bruising for secondary stroke prevention and maintain strict control of hypertension with blood pressure goal below 130/90, diabetes with hemoglobin A1c goal below 6.5% and lipids with LDL cholesterol goal below 70 mg/dL. I also advised the patient to eat a healthy diet with plenty of whole grains, cereals, fruits and vegetables, exercise regularly and maintain ideal body weight .she was also prescribed Lyrica 50 mg  twice daily to help with her cervical radicular pain which appears to be chronic. She has had extensive evaluation for this in the past and feels surgery. I discussed possible side effects with the patient and advised to call me if needed. Followup in the future with me in one year or call earlier if necessary   Delia Heady, MD  Note: This document was prepared with digital dictation and possible smart phrase technology. Any transcriptional errors that result from this process are unintentional

## 2015-10-25 DIAGNOSIS — E109 Type 1 diabetes mellitus without complications: Secondary | ICD-10-CM | POA: Diagnosis not present

## 2015-11-01 DIAGNOSIS — E109 Type 1 diabetes mellitus without complications: Secondary | ICD-10-CM | POA: Diagnosis not present

## 2015-11-18 DIAGNOSIS — K529 Noninfective gastroenteritis and colitis, unspecified: Secondary | ICD-10-CM | POA: Diagnosis not present

## 2015-11-18 DIAGNOSIS — I638 Other cerebral infarction: Secondary | ICD-10-CM | POA: Diagnosis not present

## 2015-11-18 DIAGNOSIS — E784 Other hyperlipidemia: Secondary | ICD-10-CM | POA: Diagnosis not present

## 2015-11-18 DIAGNOSIS — R21 Rash and other nonspecific skin eruption: Secondary | ICD-10-CM | POA: Diagnosis not present

## 2015-11-18 DIAGNOSIS — M859 Disorder of bone density and structure, unspecified: Secondary | ICD-10-CM | POA: Diagnosis not present

## 2015-11-18 DIAGNOSIS — M5412 Radiculopathy, cervical region: Secondary | ICD-10-CM | POA: Diagnosis not present

## 2015-11-18 DIAGNOSIS — Z6820 Body mass index (BMI) 20.0-20.9, adult: Secondary | ICD-10-CM | POA: Diagnosis not present

## 2015-11-18 DIAGNOSIS — E559 Vitamin D deficiency, unspecified: Secondary | ICD-10-CM | POA: Diagnosis not present

## 2015-11-18 DIAGNOSIS — I779 Disorder of arteries and arterioles, unspecified: Secondary | ICD-10-CM | POA: Diagnosis not present

## 2015-11-18 DIAGNOSIS — M069 Rheumatoid arthritis, unspecified: Secondary | ICD-10-CM | POA: Diagnosis not present

## 2015-11-18 DIAGNOSIS — E109 Type 1 diabetes mellitus without complications: Secondary | ICD-10-CM | POA: Diagnosis not present

## 2016-01-15 DIAGNOSIS — E784 Other hyperlipidemia: Secondary | ICD-10-CM | POA: Diagnosis not present

## 2016-01-15 DIAGNOSIS — E109 Type 1 diabetes mellitus without complications: Secondary | ICD-10-CM | POA: Diagnosis not present

## 2016-01-15 DIAGNOSIS — K529 Noninfective gastroenteritis and colitis, unspecified: Secondary | ICD-10-CM | POA: Diagnosis not present

## 2016-01-15 DIAGNOSIS — E559 Vitamin D deficiency, unspecified: Secondary | ICD-10-CM | POA: Diagnosis not present

## 2016-01-15 DIAGNOSIS — M069 Rheumatoid arthritis, unspecified: Secondary | ICD-10-CM | POA: Diagnosis not present

## 2016-01-15 DIAGNOSIS — Z6821 Body mass index (BMI) 21.0-21.9, adult: Secondary | ICD-10-CM | POA: Diagnosis not present

## 2016-01-15 DIAGNOSIS — I638 Other cerebral infarction: Secondary | ICD-10-CM | POA: Diagnosis not present

## 2016-02-11 DIAGNOSIS — H5213 Myopia, bilateral: Secondary | ICD-10-CM | POA: Diagnosis not present

## 2016-02-11 DIAGNOSIS — E109 Type 1 diabetes mellitus without complications: Secondary | ICD-10-CM | POA: Diagnosis not present

## 2016-02-17 ENCOUNTER — Ambulatory Visit (INDEPENDENT_AMBULATORY_CARE_PROVIDER_SITE_OTHER): Payer: PPO | Admitting: Orthopaedic Surgery

## 2016-02-17 ENCOUNTER — Ambulatory Visit (INDEPENDENT_AMBULATORY_CARE_PROVIDER_SITE_OTHER): Payer: PPO

## 2016-02-17 DIAGNOSIS — M542 Cervicalgia: Secondary | ICD-10-CM

## 2016-02-17 MED ORDER — GABAPENTIN 300 MG PO CAPS: 300.0000 mg | ORAL_CAPSULE | Freq: Every day | ORAL | 1 refills | Status: DC

## 2016-02-17 NOTE — Progress Notes (Signed)
Office Visit Note   Patient: Joyce Steele. Leonor Liv           Date of Birth: Jul 11, 1960           MRN: [id number] Visit Date: 02/17/2016              Requested by: Adrian Prince, MD [address] [place], [place] [address] PCP: Julian Hy, MD   Assessment & Plan: Visit Diagnoses:  1. Neck pain     Plan: Given the severity of her radicular symptoms in bilateral upper extremity weakness combined with her neck pain as well as numbness and tingling and MRI is warranted at cervical spine to rule out nerve compression. Given the significant findings of her plain films of her cervical spine and feel that MRI is warranted as well. I'm going to start her on Neurontin 300 mg to take at bedtime for now. We will see her back in 2 weeks and hopefully the MRI will been done by then.  Follow-Up Instructions: Return in about 2 weeks (around 03/02/2016).   Orders:  Orders Placed This Encounter  Procedures  . XR Cervical Spine 2 or 3 views   Meds ordered this encounter  Medications  . gabapentin (NEURONTIN) 300 MG capsule    Sig: Take 1 capsule (300 mg total) by mouth at bedtime.    Dispense:  30 capsule    Refill:  1      Procedures: No procedures performed   Clinical Data: No additional findings.   Subjective: Chief Complaint  Patient presents with  . Neck - Pain    Patient states for about 6-8 months she has had stiffness in her neck with bilateral thumb numbness   Her neck and bilateral upper extremity symptoms of a going on for about 6-8 months now. She sees a rheumatologist. She does not take anti-inflammatories and she cannot take a steroid due to the fact she is a type I diabetic. She has had a stroke in the past well. She has taken Lyrica in the past but is not on this. She is not taking gabapentin before. She wakes up with numbness in both of her hands in the morning. Of note she has had right carpal tunnel release in the past.  HPI  Review of Systems She denies any chest  pain, fever, chills, nausea, vomiting, shortness of breath  Objective: Vital Signs: There were no vitals taken for this visit.  Physical Exam She is alert and oriented 3 Ortho Exam She has full range of motion of her cervical spine with a negative Spurling sign. She has decreased grip strength bilaterally and significantly weak triceps bilaterally especially on her dominant side. She has negative Phalen's and Tinel sign of the wrist but she does have subjective decreased sensation in both of her thumbs Specialty Comments:  No specialty comments available.  Imaging: Xr Cervical Spine 2 Or 3 Views  Result Date: 02/17/2016 2 views of her cervical spine AP and lateral show loss of her cervical lordosis and severe degenerative disc disease at C5-C6 and C6-C7. There is almost complete loss of disc height at these levels and significant osteophytes.    PMFS History: Patient Active Problem List   Diagnosis Date Noted  . Stroke (HCC) 08/15/2014  . Rheumatoid arthritis (HCC) 08/15/2014  . Diabetes mellitus type 1, controlled (HCC) 08/15/2014  . Tobacco abuse 08/15/2014   Past Medical History:  Diagnosis Date  . Arthritis   . Diabetes mellitus without complication (HCC)   .  Rheumatoid arthritis (HCC)   . Stroke [facility])     Family History  Problem Relation Age of Onset  . Stroke Mother 68  . Rheum arthritis Mother   . Arthritis Mother   . Hypertension Father   . Thyroid disease Father   . Stroke Maternal Grandfather   . Stroke Paternal Grandfather     Past Surgical History:  Procedure Laterality Date  . HAND SURGERY     Social History   Occupational History  . Not on file.   Social History Main Topics  . Smoking status: Current Every Day Smoker    Packs/day: 0.25  . Smokeless tobacco: Not on file     Comment: pt smokes 4 cigarettes per day  . Alcohol use No  . Drug use: Unknown  . Sexual activity: Not on file

## 2016-02-24 ENCOUNTER — Telehealth (INDEPENDENT_AMBULATORY_CARE_PROVIDER_SITE_OTHER): Payer: Self-pay | Admitting: Orthopaedic Surgery

## 2016-02-24 ENCOUNTER — Other Ambulatory Visit (INDEPENDENT_AMBULATORY_CARE_PROVIDER_SITE_OTHER): Payer: Self-pay

## 2016-02-24 DIAGNOSIS — M542 Cervicalgia: Secondary | ICD-10-CM

## 2016-02-24 NOTE — Telephone Encounter (Signed)
Patient called asked when her MRI is going to be scheduled. Patient said she has not been called to set up appointment yet.  The number to contact her is [phone]

## 2016-02-24 NOTE — Telephone Encounter (Signed)
Please advise, let me know if there is anything I need to do

## 2016-02-24 NOTE — Telephone Encounter (Signed)
There is nothing in my work q stating she is needing an MRI, only thing I see is XR CSP

## 2016-02-24 NOTE — Telephone Encounter (Signed)
Oh no!! I just sent it to you, can we get this going, since somewhere this fell through the cracks? Thanks!

## 2016-03-02 ENCOUNTER — Ambulatory Visit (INDEPENDENT_AMBULATORY_CARE_PROVIDER_SITE_OTHER): Payer: PPO | Admitting: Physician Assistant

## 2016-03-07 ENCOUNTER — Inpatient Hospital Stay: Admission: RE | Admit: 2016-03-07 | Payer: Medicare Other | Source: Ambulatory Visit

## 2016-03-09 ENCOUNTER — Ambulatory Visit (INDEPENDENT_AMBULATORY_CARE_PROVIDER_SITE_OTHER): Payer: PPO | Admitting: Physician Assistant

## 2016-03-12 DIAGNOSIS — E109 Type 1 diabetes mellitus without complications: Secondary | ICD-10-CM | POA: Diagnosis not present

## 2016-05-01 DIAGNOSIS — Z6822 Body mass index (BMI) 22.0-22.9, adult: Secondary | ICD-10-CM | POA: Diagnosis not present

## 2016-05-01 DIAGNOSIS — Z23 Encounter for immunization: Secondary | ICD-10-CM | POA: Diagnosis not present

## 2016-05-01 DIAGNOSIS — E109 Type 1 diabetes mellitus without complications: Secondary | ICD-10-CM | POA: Diagnosis not present

## 2016-05-01 DIAGNOSIS — W5501XA Bitten by cat, initial encounter: Secondary | ICD-10-CM | POA: Diagnosis not present

## 2016-05-01 DIAGNOSIS — S61451A Open bite of right hand, initial encounter: Secondary | ICD-10-CM | POA: Diagnosis not present

## 2016-05-19 DIAGNOSIS — M0579 Rheumatoid arthritis with rheumatoid factor of multiple sites without organ or systems involvement: Secondary | ICD-10-CM | POA: Diagnosis not present

## 2016-05-19 DIAGNOSIS — Z1389 Encounter for screening for other disorder: Secondary | ICD-10-CM | POA: Diagnosis not present

## 2016-05-19 DIAGNOSIS — M5030 Other cervical disc degeneration, unspecified cervical region: Secondary | ICD-10-CM | POA: Diagnosis not present

## 2016-05-19 DIAGNOSIS — K529 Noninfective gastroenteritis and colitis, unspecified: Secondary | ICD-10-CM | POA: Diagnosis not present

## 2016-05-19 DIAGNOSIS — Z6823 Body mass index (BMI) 23.0-23.9, adult: Secondary | ICD-10-CM | POA: Diagnosis not present

## 2016-05-19 DIAGNOSIS — M8580 Other specified disorders of bone density and structure, unspecified site: Secondary | ICD-10-CM | POA: Diagnosis not present

## 2016-05-19 DIAGNOSIS — E109 Type 1 diabetes mellitus without complications: Secondary | ICD-10-CM | POA: Diagnosis not present

## 2016-05-19 DIAGNOSIS — E784 Other hyperlipidemia: Secondary | ICD-10-CM | POA: Diagnosis not present

## 2016-05-19 DIAGNOSIS — I779 Disorder of arteries and arterioles, unspecified: Secondary | ICD-10-CM | POA: Diagnosis not present

## 2016-05-19 DIAGNOSIS — I638 Other cerebral infarction: Secondary | ICD-10-CM | POA: Diagnosis not present

## 2016-05-19 DIAGNOSIS — Z6822 Body mass index (BMI) 22.0-22.9, adult: Secondary | ICD-10-CM | POA: Diagnosis not present

## 2016-05-19 DIAGNOSIS — M5412 Radiculopathy, cervical region: Secondary | ICD-10-CM | POA: Diagnosis not present

## 2016-05-19 DIAGNOSIS — M79644 Pain in right finger(s): Secondary | ICD-10-CM | POA: Diagnosis not present

## 2016-05-19 DIAGNOSIS — M069 Rheumatoid arthritis, unspecified: Secondary | ICD-10-CM | POA: Diagnosis not present

## 2016-06-18 DIAGNOSIS — E109 Type 1 diabetes mellitus without complications: Secondary | ICD-10-CM | POA: Diagnosis not present

## 2016-08-18 DIAGNOSIS — M25511 Pain in right shoulder: Secondary | ICD-10-CM | POA: Diagnosis not present

## 2016-08-18 DIAGNOSIS — M0579 Rheumatoid arthritis with rheumatoid factor of multiple sites without organ or systems involvement: Secondary | ICD-10-CM | POA: Diagnosis not present

## 2016-08-18 DIAGNOSIS — Z6822 Body mass index (BMI) 22.0-22.9, adult: Secondary | ICD-10-CM | POA: Diagnosis not present

## 2016-08-18 DIAGNOSIS — M5030 Other cervical disc degeneration, unspecified cervical region: Secondary | ICD-10-CM | POA: Diagnosis not present

## 2016-08-18 DIAGNOSIS — M79644 Pain in right finger(s): Secondary | ICD-10-CM | POA: Diagnosis not present

## 2016-09-17 ENCOUNTER — Emergency Department (HOSPITAL_COMMUNITY): Payer: PPO

## 2016-09-17 ENCOUNTER — Telehealth: Payer: Self-pay | Admitting: Neurology

## 2016-09-17 ENCOUNTER — Encounter (HOSPITAL_COMMUNITY): Payer: Self-pay | Admitting: Vascular Surgery

## 2016-09-17 ENCOUNTER — Emergency Department (HOSPITAL_COMMUNITY)
Admission: EM | Admit: 2016-09-17 | Discharge: 2016-09-17 | Disposition: A | Discharge status: Home / Self Care | Payer: PPO | Attending: Emergency Medicine | Admitting: Emergency Medicine

## 2016-09-17 DIAGNOSIS — R200 Anesthesia of skin: Secondary | ICD-10-CM | POA: Insufficient documentation

## 2016-09-17 DIAGNOSIS — F17210 Nicotine dependence, cigarettes, uncomplicated: Secondary | ICD-10-CM | POA: Insufficient documentation

## 2016-09-17 DIAGNOSIS — I6782 Cerebral ischemia: Secondary | ICD-10-CM | POA: Insufficient documentation

## 2016-09-17 DIAGNOSIS — E109 Type 1 diabetes mellitus without complications: Secondary | ICD-10-CM | POA: Insufficient documentation

## 2016-09-17 DIAGNOSIS — Z7982 Long term (current) use of aspirin: Secondary | ICD-10-CM | POA: Insufficient documentation

## 2016-09-17 DIAGNOSIS — Z794 Long term (current) use of insulin: Secondary | ICD-10-CM | POA: Diagnosis not present

## 2016-09-17 DIAGNOSIS — Z8673 Personal history of transient ischemic attack (TIA), and cerebral infarction without residual deficits: Secondary | ICD-10-CM | POA: Diagnosis not present

## 2016-09-17 DIAGNOSIS — R202 Paresthesia of skin: Secondary | ICD-10-CM | POA: Diagnosis not present

## 2016-09-17 LAB — DIFFERENTIAL
BASOS ABS: 0.1 10*3/uL (ref 0.0–0.1)
BASOS PCT: 1 %
EOS ABS: 0.3 10*3/uL (ref 0.0–0.7)
EOS PCT: 3 %
Lymphocytes Relative: 20 %
Lymphs Abs: 1.9 10*3/uL (ref 0.7–4.0)
MONOS PCT: 4 %
Monocytes Absolute: 0.4 10*3/uL (ref 0.1–1.0)
Neutro Abs: 6.8 10*3/uL (ref 1.7–7.7)
Neutrophils Relative %: 72 %

## 2016-09-17 LAB — COMPREHENSIVE METABOLIC PANEL
ALT: 12 U/L — ABNORMAL LOW (ref 14–54)
ANION GAP: 9 (ref 5–15)
AST: 22 U/L (ref 15–41)
Albumin: 4 g/dL (ref 3.5–5.0)
Alkaline Phosphatase: 114 U/L (ref 38–126)
BILIRUBIN TOTAL: 0.6 mg/dL (ref 0.3–1.2)
BUN: 8 mg/dL (ref 6–20)
CHLORIDE: 103 mmol/L (ref 101–111)
CO2: 25 mmol/L (ref 22–32)
Calcium: 9.4 mg/dL (ref 8.9–10.3)
Creatinine, Ser: 0.46 mg/dL (ref 0.44–1.00)
Glucose, Bld: 76 mg/dL (ref 65–99)
POTASSIUM: 3.9 mmol/L (ref 3.5–5.1)
Sodium: 137 mmol/L (ref 135–145)
TOTAL PROTEIN: 6.9 g/dL (ref 6.5–8.1)

## 2016-09-17 LAB — I-STAT CHEM 8, ED
BUN: 10 mg/dL (ref 6–20)
Calcium, Ion: 1.19 mmol/L (ref 1.15–1.40)
Chloride: 104 mmol/L (ref 101–111)
Creatinine, Ser: 0.4 mg/dL — ABNORMAL LOW (ref 0.44–1.00)
GLUCOSE: 70 mg/dL (ref 65–99)
HEMATOCRIT: 40 % (ref 36.0–46.0)
HEMOGLOBIN: 13.6 g/dL (ref 12.0–15.0)
Potassium: 3.8 mmol/L (ref 3.5–5.1)
Sodium: 140 mmol/L (ref 135–145)
TCO2: 27 mmol/L (ref 0–100)

## 2016-09-17 LAB — URINALYSIS, ROUTINE W REFLEX MICROSCOPIC
Bilirubin Urine: NEGATIVE
Glucose, UA: NEGATIVE mg/dL
HGB URINE DIPSTICK: NEGATIVE
Ketones, ur: NEGATIVE mg/dL
LEUKOCYTES UA: NEGATIVE
Nitrite: NEGATIVE
PROTEIN: NEGATIVE mg/dL
SPECIFIC GRAVITY, URINE: 1.002 — AB (ref 1.005–1.030)
pH: 7 (ref 5.0–8.0)

## 2016-09-17 LAB — PROTIME-INR
INR: 0.93
Prothrombin Time: 12.5 seconds (ref 11.4–15.2)

## 2016-09-17 LAB — CBC
HEMATOCRIT: 40.9 % (ref 36.0–46.0)
Hemoglobin: 13.6 g/dL (ref 12.0–15.0)
MCH: 30.4 pg (ref 26.0–34.0)
MCHC: 33.3 g/dL (ref 30.0–36.0)
MCV: 91.5 fL (ref 78.0–100.0)
Platelets: 203 10*3/uL (ref 150–400)
RBC: 4.47 MIL/uL (ref 3.87–5.11)
RDW: 13.8 % (ref 11.5–15.5)
WBC: 9.5 10*3/uL (ref 4.0–10.5)

## 2016-09-17 LAB — RAPID URINE DRUG SCREEN, HOSP PERFORMED
Amphetamines: NOT DETECTED
BENZODIAZEPINES: NOT DETECTED
Barbiturates: NOT DETECTED
Cocaine: NOT DETECTED
OPIATES: NOT DETECTED
Tetrahydrocannabinol: NOT DETECTED

## 2016-09-17 LAB — APTT: APTT: 24 s (ref 24–36)

## 2016-09-17 LAB — I-STAT TROPONIN, ED: TROPONIN I, POC: 0 ng/mL (ref 0.00–0.08)

## 2016-09-17 LAB — ETHANOL

## 2016-09-17 NOTE — ED Notes (Signed)
Pt transported to MRI on stretcher with tech. 

## 2016-09-17 NOTE — Telephone Encounter (Signed)
Agree. Work in to see NP next available

## 2016-09-17 NOTE — ED Provider Notes (Signed)
Blood pressure (!) 152/76, pulse 89, temperature 97.5 F (36.4 C), temperature source Oral, resp. rate 20, SpO2 100 %.  Assuming care from Dr. Julieanne Manson.  In short, Joyce Steele is a 56 y.o. female with a chief complaint of Numbness .  Refer to the original H&P for additional details.  The current plan of care is to f/u with MRI and reassess. Patient has had a CVA and is compliant with medication. Will follow with outpatient Neurology if MRI negative for acute CVA.   08:26 PM Patient with no evidence of acute stroke on MRI. I updated her and her husband at bedside. They will follow with her outpatient neurologist.   At this time, I do not feel there is any life-threatening condition present. I have reviewed and discussed all results (EKG, imaging, lab, urine as appropriate), exam findings with patient. I have reviewed nursing notes and appropriate previous records.  I feel the patient is safe to be discharged home without further emergent workup. Discussed usual and customary return precautions. Patient and family (if present) verbalize understanding and are comfortable with this plan.  Patient will follow-up with their primary care provider. If they do not have a primary care provider, information for follow-up has been provided to them. All questions have been answered.  Alona Bene, MD    Maia Plan, MD 09/17/16 2027

## 2016-09-17 NOTE — ED Triage Notes (Signed)
Pt reports to the ED for eval of of numbness and tingling to her right cheek/chin area and drooling. Symptoms started yesterday. She reports she has type 1 diabetes and her BG has been high in the mornings so she had a HA yesterday. She denies any HA at this time. She denies any difficulty walking, talking, or vision changes. She states that she has had some issues with her balance x 1-1.5 weeks. She has hx of stroke in 2016. Denies any unilateral weakness or dysphagia. She states her symptoms she is having right now were similar to her symptoms with her stroke but the current symptoms are less severe.

## 2016-09-17 NOTE — ED Notes (Signed)
Per pt she checked her BG upon arrival and it was 58 mg/dl. She states she has hx of hypoglycemia. Swallow screen done/passed and pt given orange juice to help with the CBG.

## 2016-09-17 NOTE — ED Notes (Signed)
Returned from MRI 

## 2016-09-17 NOTE — Discharge Instructions (Signed)
Follow-up with your neurologist if you continue having intermittent symptoms. Return here for any new/worsening symptoms.

## 2016-09-17 NOTE — ED Provider Notes (Signed)
[facility]ENCY DEPT Provider Note   CSN: [id number] Arrival date & time: 09/17/16  1258     History   Chief Complaint Chief Complaint  Patient presents with  . Numbness    HPI Joyce Steele is a 56 y.o. female.  The history is provided by the patient and medical records.    55 y.o. F with hx of DM, RA, hx of left lacunar infarct in may 2016, Presenting to the ED for tingling and numb sensation in her right cheek with some intermittent drooling. Patient reports the symptoms have been occurring intermittently over the past week and a half. She does not notice any alleviating or exacerbating factors. States she's not had any trouble eating, drinking, or swallowing. No dental pain or other issues.  No facial drooping.  Has been reports her speech has been normal, but sometimes she does mix up her words. Patient reports she does seem to feel this when her blood sugar is low. States she has not had any numbness or weakness of her arms or legs. Husband has noticed that she has stumbled twice in the past week and has always felt to her right side which is somewhat abnormal for her. Patient reports the symptoms became concerning to her as her prior stroke in 2016 had similar symptoms. States symptoms at that time resolved after about one month and she's not had any recurrence until recently. Patient has been compliant with her aspirin and Plavix. She did call her neurologist today, Dr. Pearlean Brownie who encouraged her to come here for evaluation.  Past Medical History:  Diagnosis Date  . Arthritis   . Diabetes mellitus without complication (HCC)   . Rheumatoid arthritis (HCC)   . Stroke [facility])     Patient Active Problem List   Diagnosis Date Noted  . Stroke (HCC) 08/15/2014  . Rheumatoid arthritis (HCC) 08/15/2014  . Diabetes mellitus type 1, controlled (HCC) 08/15/2014  . Tobacco abuse 08/15/2014    Past Surgical History:  Procedure Laterality Date  . HAND SURGERY      OB History    No data  available       Home Medications    Prior to Admission medications   Medication Sig Start Date End Date Taking? Authorizing Provider  aspirin 81 MG tablet Take 1 tablet (81 mg total) by mouth daily. 08/06/15   Micki Riley, MD  atorvastatin (LIPITOR) 40 MG tablet Take 1 tablet (40 mg total) by mouth daily at 6 PM. Patient not taking: Reported on 02/17/2016 08/16/14   Edsel Petrin, DO  gabapentin (NEURONTIN) 300 MG capsule Take 1 capsule (300 mg total) by mouth at bedtime. 02/17/16   Kathryne Hitch, MD  insulin aspart (NOVOLOG) 100 UNIT/ML injection Inject 30 Units into the skin 3 (three) times daily before meals.    [provider]  leflunomide (ARAVA) 20 MG tablet Take 20 mg by mouth daily.    [provider]  LORazepam (ATIVAN) 0.5 MG tablet as needed.  06/24/15   [provider]  metoprolol succinate (TOPROL-XL) 25 MG 24 hr tablet TAKE 1 TABLET BY MOUTH at bedtime for blood pressure and pulse control 05/28/15   [provider]  pregabalin (LYRICA) 50 MG capsule Take 1 capsule (50 mg total) by mouth 2 (two) times daily. Patient not taking: Reported on 02/17/2016 08/06/15   Micki Riley, MD  venlafaxine XR (EFFEXOR-XR) 75 MG 24 hr capsule Take 75 mg by mouth daily with breakfast.    [provider]  zolpidem (AMBIEN CR) 12.5 MG CR tablet Take 12.5 mg by mouth at bedtime. Take every night per patient    [provider]    Family History Family History  Problem Relation Age of Onset  . Stroke Mother 43  . Rheum arthritis Mother   . Arthritis Mother   . Hypertension Father   . Thyroid disease Father   . Stroke Maternal Grandfather   . Stroke Paternal Grandfather     Social History Social History  Substance Use Topics  . Smoking status: Current Every Day Smoker    Packs/day: 0.25  . Smokeless tobacco: Never Used     Comment: pt smokes 4 cigarettes per day  . Alcohol use No     Allergies   Patient has no known  allergies.   Review of Systems Review of Systems  Neurological: Positive for numbness.  All other systems reviewed and are negative.    Physical Exam Updated Vital Signs BP (!) 157/87 (BP Location: Right Arm)   Pulse 93   Temp 98 F (36.7 C) (Oral)   Resp 16   SpO2 99%   Physical Exam  Constitutional: She is oriented to person, place, and time. She appears well-developed and well-nourished.  HENT:  Head: Normocephalic and atraumatic.  Mouth/Throat: Oropharynx is clear and moist.  Eyes: Conjunctivae and EOM are normal. Pupils are equal, round, and reactive to light.  Neck: Normal range of motion.  Cardiovascular: Normal rate, regular rhythm and normal heart sounds.   Pulmonary/Chest: Effort normal and breath sounds normal.  Abdominal: Soft. Bowel sounds are normal.  Musculoskeletal: Normal range of motion.  Neurological: She is alert and oriented to person, place, and time.  AAOx3, answering questions and following commands appropriately; equal strength UE and LE bilaterally; moves all extremities appropriately without ataxia; normal rapid alternating hand movements, normal finger to nose and heel-to-shin bilaterally, normal sensation of face, trunk, arms, and legs bilaterally, speaking is clear and goal oriented, symmetric smile and forehead wrinkle  Skin: Skin is warm and dry.  Psychiatric: She has a normal mood and affect.  Nursing note and vitals reviewed.    ED Treatments / Results  Labs (all labs ordered are listed, but only abnormal results are displayed) Labs Reviewed  COMPREHENSIVE METABOLIC PANEL - Abnormal; Notable for the following:       Result Value   ALT 12 (*)    All other components within normal limits  URINALYSIS, ROUTINE W REFLEX MICROSCOPIC - Abnormal; Notable for the following:    Color, Urine STRAW (*)    Specific Gravity, Urine 1.002 (*)    All other components within normal limits  I-STAT CHEM 8, ED - Abnormal; Notable for the following:     Creatinine, Ser 0.40 (*)    All other components within normal limits  ETHANOL  PROTIME-INR  APTT  CBC  DIFFERENTIAL  RAPID URINE DRUG SCREEN, HOSP PERFORMED  I-STAT TROPOININ, ED    EKG  EKG Interpretation None       Radiology Ct Head Wo Contrast  Result Date: 09/17/2016 CLINICAL DATA:  Right-sided facial numbness.  Unsteady gait. EXAM: CT HEAD WITHOUT CONTRAST TECHNIQUE: Contiguous axial images were obtained from the base of the skull through the vertex without intravenous contrast. COMPARISON:  08/15/2014 brain MRI FINDINGS: Brain: There is a 1 cm low-density in the central right thalamus. A lacunar infarct is seen in this location on 2016 brain MRI, but the current abnormality is larger. More subtle ovoid  low-density in the lower left thalamus. Confluent low-density in the deep cerebral white matter with right corona radiata lacune, advanced for age chronic small vessel disease. No hemorrhage, signs of cortical infarct, or hydrocephalus. No masslike findings. Vascular: Mild arterial calcification.  No hyperdense vessel. Skull: No acute or aggressive finding Sinuses/Orbits: Frothy secretions seen in the patent left sphenoid sinus. IMPRESSION: Age-indeterminate bilateral thalamus infarcts. Chronic ischemia was seen in the thalami on 2016 brain MRI, but the current findings are more extensive. Chronic small-vessel disease is advanced and diffuse in the deep cerebral white matter. Electronically Signed   By: Marnee Spring M.D.   On: 09/17/2016 14:11    Procedures Procedures (including critical care time)  Medications Ordered in ED Medications - No data to display   Initial Impression / Assessment and Plan / ED Course  I have reviewed the triage vital signs and the nursing notes.  Pertinent labs & imaging results that were available during my care of the patient were reviewed by me and considered in my medical decision making (see chart for details).  56 year old female here with  intermittent numbness and tingling of right cheek and some intermittent drooling. Reports similar symptoms in 2016 with prior stroke. She is awake, alert, appropriately oriented here. She has no focal neurologic deficits on exam. Her speech is clear and goal oriented. She has no evidence of drooling, dental infection, oral swelling, or airway compromise.  Initial labs reassuring.  CT had with age indeterminate bilateral thalamus infarcts.  Chronic ischemia was seen at this area on her 2016 brain MRI, but findings today are more extensive. Case has been discussed with neurology, Dr. Roxy Manns-- recommends MRI now, if negative can be discharged home to follow-up with her OP neurologist, continue ASA and plavix.  If any acute findings, will need admission.  Patient and husband made aware of this plan, they acknowledged understanding.  Care signed out to oncoming team-- will follow-up on MRI and disposition appropriately.  Final Clinical Impressions(s) / ED Diagnoses   Final diagnoses:  Numbness and tingling of right side of face    New Prescriptions New Prescriptions   No medications on file     Garlon Hatchet, PA-C 09/17/16 1530    Tegeler, Canary Brim, MD 09/17/16 [phone]

## 2016-09-17 NOTE — Telephone Encounter (Signed)
Pt called said she is experiencing tingling right cheek, slight drool right side of the mouth intermittently during day for about 1-1/2 week. She said these are the symptoms she had prior to her stoke 5/16. Pt was requesting to be seen by Dr Pearlean Brownie or NP. She was advised to go to ED. I advised her I would send a msg to RN also and have her to touch base with her today.  Rn came into phone room while patient was still on with operator and advised she go to ED per Dr Pearlean Brownie and RN and for pt to call back to schedule follow up appt. Pt understood and was very thankful for the advice. She will go to [facility].

## 2016-09-17 NOTE — Telephone Encounter (Signed)
Message sent to Dr. Pearlean Brownie for review and FYI.

## 2016-10-15 ENCOUNTER — Ambulatory Visit: Payer: PPO | Admitting: Neurology

## 2016-10-19 ENCOUNTER — Encounter: Payer: Self-pay | Admitting: Neurology

## 2016-10-20 DIAGNOSIS — E559 Vitamin D deficiency, unspecified: Secondary | ICD-10-CM | POA: Diagnosis not present

## 2016-10-20 DIAGNOSIS — Z1389 Encounter for screening for other disorder: Secondary | ICD-10-CM | POA: Diagnosis not present

## 2016-10-20 DIAGNOSIS — E784 Other hyperlipidemia: Secondary | ICD-10-CM | POA: Diagnosis not present

## 2016-10-20 DIAGNOSIS — Z6821 Body mass index (BMI) 21.0-21.9, adult: Secondary | ICD-10-CM | POA: Diagnosis not present

## 2016-10-20 DIAGNOSIS — E109 Type 1 diabetes mellitus without complications: Secondary | ICD-10-CM | POA: Diagnosis not present

## 2016-10-20 DIAGNOSIS — I638 Other cerebral infarction: Secondary | ICD-10-CM | POA: Diagnosis not present

## 2016-10-20 DIAGNOSIS — M069 Rheumatoid arthritis, unspecified: Secondary | ICD-10-CM | POA: Diagnosis not present

## 2016-10-20 DIAGNOSIS — F320 Major depressive disorder, single episode, mild: Secondary | ICD-10-CM | POA: Diagnosis not present

## 2016-10-23 ENCOUNTER — Encounter: Payer: Self-pay | Admitting: Neurology

## 2016-11-05 DIAGNOSIS — E109 Type 1 diabetes mellitus without complications: Secondary | ICD-10-CM | POA: Diagnosis not present

## 2016-11-26 ENCOUNTER — Ambulatory Visit: Payer: PPO | Admitting: Neurology

## 2017-02-03 DIAGNOSIS — E109 Type 1 diabetes mellitus without complications: Secondary | ICD-10-CM | POA: Diagnosis not present

## 2017-02-16 DIAGNOSIS — Z9641 Presence of insulin pump (external) (internal): Secondary | ICD-10-CM | POA: Diagnosis not present

## 2017-02-16 DIAGNOSIS — F17210 Nicotine dependence, cigarettes, uncomplicated: Secondary | ICD-10-CM | POA: Diagnosis not present

## 2017-02-16 DIAGNOSIS — R1310 Dysphagia, unspecified: Secondary | ICD-10-CM | POA: Diagnosis not present

## 2017-02-16 DIAGNOSIS — R0789 Other chest pain: Secondary | ICD-10-CM | POA: Diagnosis not present

## 2017-02-16 DIAGNOSIS — I6523 Occlusion and stenosis of bilateral carotid arteries: Secondary | ICD-10-CM | POA: Diagnosis not present

## 2017-02-16 DIAGNOSIS — R270 Ataxia, unspecified: Secondary | ICD-10-CM | POA: Diagnosis not present

## 2017-02-16 DIAGNOSIS — Z8673 Personal history of transient ischemic attack (TIA), and cerebral infarction without residual deficits: Secondary | ICD-10-CM | POA: Diagnosis not present

## 2017-02-16 DIAGNOSIS — M069 Rheumatoid arthritis, unspecified: Secondary | ICD-10-CM | POA: Diagnosis not present

## 2017-02-16 DIAGNOSIS — E162 Hypoglycemia, unspecified: Secondary | ICD-10-CM | POA: Diagnosis not present

## 2017-02-16 DIAGNOSIS — I639 Cerebral infarction, unspecified: Secondary | ICD-10-CM | POA: Diagnosis not present

## 2017-02-16 DIAGNOSIS — R531 Weakness: Secondary | ICD-10-CM | POA: Diagnosis not present

## 2017-02-16 DIAGNOSIS — R269 Unspecified abnormalities of gait and mobility: Secondary | ICD-10-CM | POA: Diagnosis not present

## 2017-02-16 DIAGNOSIS — R42 Dizziness and giddiness: Secondary | ICD-10-CM | POA: Diagnosis not present

## 2017-02-16 DIAGNOSIS — R2689 Other abnormalities of gait and mobility: Secondary | ICD-10-CM | POA: Diagnosis not present

## 2017-02-16 DIAGNOSIS — R29700 NIHSS score 0: Secondary | ICD-10-CM | POA: Diagnosis not present

## 2017-02-16 DIAGNOSIS — E10649 Type 1 diabetes mellitus with hypoglycemia without coma: Secondary | ICD-10-CM | POA: Diagnosis not present

## 2017-02-24 DIAGNOSIS — E7849 Other hyperlipidemia: Secondary | ICD-10-CM | POA: Diagnosis not present

## 2017-02-24 DIAGNOSIS — E109 Type 1 diabetes mellitus without complications: Secondary | ICD-10-CM | POA: Diagnosis not present

## 2017-02-24 DIAGNOSIS — M069 Rheumatoid arthritis, unspecified: Secondary | ICD-10-CM | POA: Diagnosis not present

## 2017-02-24 DIAGNOSIS — Z6822 Body mass index (BMI) 22.0-22.9, adult: Secondary | ICD-10-CM | POA: Diagnosis not present

## 2017-02-24 DIAGNOSIS — E1059 Type 1 diabetes mellitus with other circulatory complications: Secondary | ICD-10-CM | POA: Diagnosis not present

## 2017-02-24 DIAGNOSIS — M859 Disorder of bone density and structure, unspecified: Secondary | ICD-10-CM | POA: Diagnosis not present

## 2017-02-24 DIAGNOSIS — Z1389 Encounter for screening for other disorder: Secondary | ICD-10-CM | POA: Diagnosis not present

## 2017-02-24 DIAGNOSIS — I6389 Other cerebral infarction: Secondary | ICD-10-CM | POA: Diagnosis not present

## 2017-02-24 DIAGNOSIS — F320 Major depressive disorder, single episode, mild: Secondary | ICD-10-CM | POA: Diagnosis not present

## 2017-02-24 DIAGNOSIS — E559 Vitamin D deficiency, unspecified: Secondary | ICD-10-CM | POA: Diagnosis not present

## 2017-02-26 DIAGNOSIS — I639 Cerebral infarction, unspecified: Secondary | ICD-10-CM | POA: Diagnosis not present

## 2017-02-26 DIAGNOSIS — E109 Type 1 diabetes mellitus without complications: Secondary | ICD-10-CM | POA: Diagnosis not present

## 2017-02-26 DIAGNOSIS — R52 Pain, unspecified: Secondary | ICD-10-CM | POA: Diagnosis not present

## 2017-02-26 DIAGNOSIS — Z720 Tobacco use: Secondary | ICD-10-CM | POA: Diagnosis not present

## 2017-02-28 ENCOUNTER — Other Ambulatory Visit: Payer: Self-pay

## 2017-02-28 NOTE — Patient Outreach (Signed)
[facility]) Care Management  02/28/2017  Joyce Steele 01/15/1961 [id number]   Transition of care  Referral date: 02/25/17 Referral source: discharged from an inpatient admission from [facility] on 02/17/17 Insurance: Health team advantage  Telephone call to patient for transition assessment. Unable to reach patient. HIPAA compliant voice message left with call  Back phone number.   PLAN: RNCM will attempt 2nd telephone call to patient within 3 business days.   George Ina RN,BSN,CCM [facility] Telephonic  [phone]

## 2017-03-02 ENCOUNTER — Other Ambulatory Visit: Payer: Self-pay

## 2017-03-02 NOTE — Patient Outreach (Signed)
[facility]) Care Management  03/02/2017  Joyce Steele 05/17/1960 [id number]   Transition of care  Referral date: 02/25/17 Referral source: discharged from an inpatient admission from [facility] on 02/17/17 Insurance: Health team advantage Attempt #2  Second telephone call to patient for transition assessment. Unable to reach patient. HIPAA compliant voice message left with call  Back phone number.   PLAN: RNCM will attempt 3rd telephone call to patient within 3 business days.   George Ina RN,BSN,CCM [facility] Telephonic  [phone]

## 2017-03-03 ENCOUNTER — Other Ambulatory Visit: Payer: Self-pay

## 2017-03-03 NOTE — Patient Outreach (Signed)
[facility]) Care Management  03/03/2017  Joyce Steele [birth year] [id number]  Transition of care  Referral date: 02/25/17 Referral source: discharged from an inpatient admission from [facility] on 02/17/17 Insurance: Health team advantage Attempt #3  Telephone call to patient for transition assessment. Unable to reach patient. HIPAA compliant voice message left with call  Back phone number.   PLAN: RNCM will send patient outreach letter to attempt contact.  George Ina RN,BSN,CCM [facility] Telephonic  [phone]

## 2017-03-17 ENCOUNTER — Other Ambulatory Visit: Payer: Self-pay

## 2017-03-17 NOTE — Patient Outreach (Signed)
[facility]) Care Management  03/17/2017  Chaunta K. Ritacco 01/22/1961 [id number]   Transition of care  Referral date:02/25/17 Referral source:discharged from an inpatient admission from [facility] on 02/17/17 Insurance:Health team advantage  No response from patient after 3 telephone calls and outreach letter attempt.  PLAN; RNCM will refer patient to care management assistant to close due to being unable to reach.  RNCM will send notification to patients primary MD of closure.   George Ina RN,BSN,CCM [facility] Telephonic  [phone]

## 2017-05-12 ENCOUNTER — Encounter (INDEPENDENT_AMBULATORY_CARE_PROVIDER_SITE_OTHER): Payer: Self-pay

## 2017-05-12 ENCOUNTER — Ambulatory Visit: Payer: PPO | Admitting: Neurology

## 2017-05-12 ENCOUNTER — Encounter: Payer: Self-pay | Admitting: Neurology

## 2017-05-12 VITALS — BP 148/85 | HR 81 | Wt 134.0 lb

## 2017-05-12 DIAGNOSIS — I6381 Other cerebral infarction due to occlusion or stenosis of small artery: Secondary | ICD-10-CM | POA: Diagnosis not present

## 2017-05-12 NOTE — Progress Notes (Signed)
[facility] [address] [place]. [place] [address] [phone]       OFFICE FOLLOW-UP NOTE  [name] Date of Birth:  04/20/1960 Medical Record Number:  [id number]   HPI:  Update 05/12/2017: Joyce Steele is a 57 year old female who is being seen today after a new stroke at [facility] on 02/16/17. All information obtained from patient and chart.through care everywhere. All imaging's and lab results personally reviewed. 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  Pt felt she becameweak on the left side on 02/16/17 and started leaning towards the left as though "someone was pulling me down". Denies right sided weakness or facial droop. Denies speech difficulties or vision difficulties. MRI showed acute infarctions within right posterior frontal periventricular white matter and left cingulate subcortical white matter; MRI also showed several small chronic lacunar infarctions . CT scan negative . Carotid doppler negative for stenosis.transthoracic Echo negative for PFO. Pt was previously prescribed aspirin 325mg  and lipitor 40mg  but discontinued them (patient unsure the date of stopping these medications and also cannot state why she stopped taking them). LDL was 146 and it was recommended during hospitalization to to restart lipitor 40mg  and aspirin 325mg . She was  started cozaar due to hypertension. Pt is currently being follow by endocrinology for her type 1 diabetes in which she uses in insulin pump. Patient declined PT/OT services. She states she is able to walk well except her left leg occasionally causes some imbalance Since discharge, pt states her balance is improving but still slightly off. She also has complaints of fatigue. She quit smoking after the strokein Nov 2018. Pt states her DMI has not been in good control. She has an appointment tomorrow with her  endocrinologist to adjust her insulin levels in which she currently uses an insulin pump. Pt continues to take her lipitor 40mg  without side effects. Pt continues to take aspirin 325mg  and plavix 75mg  (which was prescribed at hospital discharge) at this time. Denies side effects such as upset stomach, increased bruising or increased bleeding. No recent increase in TIA/stroke symptoms.   Previous visit 08/06/15 Joyce Steele is an 57 y.o. female, right handed, with a past medical history significant for DM type 1, RA, smoking, admitted to [facility] for further evaluation of numbness right face-arm. Stated that she never had similar symptoms before. Went to bed 08/14/14 night feeling well but woke up around 8:45 next morning with a numb sensation involving her right face including right side of her lips and tongue as well as the right arm. (LKW 08/14/2014, time unknown). She denied associated HA, vertigo, double vision, difficulty swallowing, focal weakness, unsteadiness, confusion, slurred speech, language or vision impairment. She indicated that her symptoms did not get better and drove to her endocrinologist office who sent her to [facility] for urgent MRI brain. I did review her brain MRI and there is evidence of an acute left lacunar infarct. Joyce Steele said that her symptoms remain unchanged. Patient was not administered TPA secondary to delay in arrival. She was admitted for further evaluation and treatment. MRI scan of the brain showed acute nonhemorrhagic subcentimeter infarct involving the left thalamus which could explain her sensory symptoms. MRA of the brain showed moderate mid to distal posterior cerebral artery irregularity but no large vessel stenosis. Carotid ultrasound showed no significant stenosis transversely echo showed normal ejection fraction. LDL cholesterol  is elevated and 153 and hemoglobin A1c at 6.9. Patient was started on Lipitor 40 Grams daily and aspirin as well as blood pressure medications. She  states she's done well since discharge and numbness on the right side of the face as well as hand is reduced but it is still present. She is at a flareup and arthritis. She does not work as she is on disability from her rheumatoid arthritis. She had repeat lipid profile checked by her primary physician and it was fine last month. She remains on aspirin which is tolerating well with only minor bruising and no bleeding episodes. She has no new neurological complaints. Update 08/06/2015 : She returns for follow-up after last visit 6 months ago. He is doing well from the stroke standpoint without recurrent stroke or TIAs. She still has some numbness around the right thumb which is not bothersome. She is remains on aspirin but does complain of bruising in her legs. She has had no bleeding problems. She is tolerating Lipitor well without any myalgias or arthralgias. She states her blood pressure is well controlled on metoprolol and today it is 129/81 office. She states her rheumatoid arthritis also appears to be quite well controlled and she sees Dr. Dierdre Forth for that. She has a new complaint today of right lateral neck pain which radiates down into her right thumb. This is a chronic problem upcoming for years and occurs daily. Neck movements with a right painful. At times she has trouble sleeping because of the pain. She has had extensive evaluation for this 5 years ago including x-rays and MRI scan. She was told she had a bulging disc and she saw Dr. Magnus Ivan from orthopedics .She refused surgery and had epidural spinal injections but without relief. She is currently not taking any medication for this particular pain though in the past she has tried gabapentin which has not worked well.  ROS:   14 system review of systems is positive for  Joint pain, aching muscles, and weakness and all other systems negative.   PMH:  Past Medical History:  Diagnosis Date  . Arthritis   . Diabetes mellitus without complication  (HCC)   . Rheumatoid arthritis (HCC)   . Stroke [facility])     Social History:  Social History   Socioeconomic History  . Marital status: Legally Separated    Spouse name: Not on file  . Number of children: Not on file  . Years of education: Not on file  . Highest education level: Not on file  Social Needs  . Financial resource strain: Not on file  . Food insecurity - worry: Not on file  . Food insecurity - inability: Not on file  . Transportation needs - medical: Not on file  . Transportation needs - non-medical: Not on file  Occupational History  . Not on file  Tobacco Use  . Smoking status: Former Smoker    Packs/day: 0.25    Last attempt to quit: 03/01/2017    Years since quitting: 0.1  . Smokeless tobacco: Never Used  . Tobacco comment: pt smokes 4 cigarettes per day  Substance and Sexual Activity  . Alcohol use: No    Alcohol/week: 0.0 oz  . Drug use: No  . Sexual activity: Not on file  Other Topics Concern  . Not on file  Social History Narrative  . Not on file    Medications:   Current Outpatient Medications on File Prior to Visit  Medication Sig Dispense Refill  . aspirin  EC 325 MG tablet Take 325 mg by mouth daily.    Marland Kitchen atorvastatin (LIPITOR) 40 MG tablet Take 1 tablet (40 mg total) by mouth daily at 6 PM. 30 tablet 0  . clopidogrel (PLAVIX) 75 MG tablet Take by mouth.    . insulin aspart (NOVOLOG) 100 UNIT/ML injection See admin instructions. Per insulin pump    . losartan (COZAAR) 25 MG tablet Take by mouth.    . metoprolol succinate (TOPROL-XL) 25 MG 24 hr tablet TAKE 1 TABLET BY MOUTH at bedtime for blood pressure and pulse control  0  . venlafaxine XR (EFFEXOR-XR) 75 MG 24 hr capsule Take 75 mg by mouth daily with breakfast.    . zolpidem (AMBIEN CR) 12.5 MG CR tablet Take 12.5 mg by mouth at bedtime.      No current facility-administered medications on file prior to visit.     Allergies:   Allergies  Allergen Reactions  . Shellfish Allergy  Anaphylaxis and Swelling  . Lyrica [Pregabalin] Swelling    Made the patient's feet swell    Physical Exam General: well developed, well nourished middle-aged Caucasian lady seated, in no evident distress Head: head normocephalic and atraumatic.  Neck: supple with no carotid or supraclavicular bruits Cardiovascular: regular rate and rhythm, no murmurs Musculoskeletal: no deformity Skin:  no rash/petichiae Vascular:  Normal pulses all extremities Vitals:   05/12/17 1418  BP: (!) 148/85  Pulse: 81   Neurologic Exam Mental Status: Awake and fully alert. Oriented to place and time. Recent and remote memory intact. Attention span, concentration and fund of knowledge appropriate. Mood and affect appropriate.  Cranial Nerves: Fundoscopic exam reveals sharp disc margins. Pupils equal, briskly reactive to light. Extraocular movements full without nystagmus. Visual fields full to confrontation. Hearing intact. Facial sensation intact. Face, tongue, palate moves normally and symmetrically.  Motor: Normal bulk and tone. Slight weakness in bilateral hip flexors. All other extremities 5/5 strength. Fine finger movements are diminished on the left Sensory.: intact to touch ,pinprick .position and vibratory sensation. Mild subjective restoration as around the right thumb but no objective sensory loss Coordination: Rapid alternating movements decreased in left hand. Orbiting present around left arm. Finger-to-nose and heel-to-shin performed accurately bilaterally.  Gait and Station: Arises from chair without difficulty. Stance is normal. Gait demonstrates normal stride length and balance . Able to heel, toe and tandem walk without difficulty.  Reflexes: 1+ and symmetric. Toes downgoing.   NIHSS 0 Modified Rankin 1    ASSESSMENT: 43 year Caucasian lady with left thalamic infarct in May 2016 with recent right frontal white matter infarct and left subcortical whiter infarct on 02/16/17 secondary to small  vessel disease with vascular risk factors of hypertension, diabetes and hyperlipidemia.    PLAN: I had a long d/w patient about her recent lacunar strokes, risk for recurrent strokes, personally independently reviewed imaging studies and stroke evaluation results and answered questions.Continue clopidogrel 75 mg daily and stop aspirin 325mg  for secondary stroke prevention and maintain strict control of hypertension with blood pressure goal below 130/90, diabetes with hemoglobin A1c goal below 6.5% and lipids with LDL cholesterol goal below 70 mg/dL. Continue lipitor 40mg  daily for LDL. Decrease A1c with f/u of endocrinologist. Continue smoking cessation. As patient was taking both aspirin 325mg  and plavix 75mg , patient was advised to stop the aspirin 325mg  and only continue with plavix 75mg . I also advised the patient to eat a healthy diet with plenty of whole grains, cereals, fruits and vegetables, exercise regularly and maintain  ideal body weight Followup in the future with me in 6 months. Greater than 50% time during this 25 minute   visit was spent on counseling and coordination of care about her recent stroke, HTN, HLD, and DM1 control (risk factors), discussion about importance of maintaining good levels and continuation of taking medications to prevent recurrent stroke.  Delia Heady, MD  [facility] [address] [place], [place] [address]  Phone [phone] Fax [phone]

## 2017-05-12 NOTE — Patient Instructions (Signed)
I had a long d/w patient about his recent stroke, risk for recurrent stroke/TIAs, personally independently reviewed imaging studies and stroke evaluation results and answered questions.Continue clopidogrel 75 mg daily  for secondary stroke prevention and maintain strict control of hypertension with blood pressure goal below 130/90, diabetes with hemoglobin A1c goal below 6.5% and lipids with LDL cholesterol goal below 70 mg/dL. Continue lipitor 40mg  daily to help lower cholesterol. Maintain control over type 1 diabetes with help of endocrinologist. job quitting smoking, please continue smoking cessation.  I also advised the patient to eat a healthy diet with plenty of whole grains, cereals, fruits and vegetables, exercise regularly and maintain ideal body weight Followup in the future with me in 6 months.

## 2017-05-28 DIAGNOSIS — E559 Vitamin D deficiency, unspecified: Secondary | ICD-10-CM | POA: Diagnosis not present

## 2017-05-28 DIAGNOSIS — Z1389 Encounter for screening for other disorder: Secondary | ICD-10-CM | POA: Diagnosis not present

## 2017-05-28 DIAGNOSIS — E7849 Other hyperlipidemia: Secondary | ICD-10-CM | POA: Diagnosis not present

## 2017-05-28 DIAGNOSIS — M069 Rheumatoid arthritis, unspecified: Secondary | ICD-10-CM | POA: Diagnosis not present

## 2017-05-28 DIAGNOSIS — E1059 Type 1 diabetes mellitus with other circulatory complications: Secondary | ICD-10-CM | POA: Diagnosis not present

## 2017-05-28 DIAGNOSIS — I779 Disorder of arteries and arterioles, unspecified: Secondary | ICD-10-CM | POA: Diagnosis not present

## 2017-05-28 DIAGNOSIS — M859 Disorder of bone density and structure, unspecified: Secondary | ICD-10-CM | POA: Diagnosis not present

## 2017-05-28 DIAGNOSIS — Z6822 Body mass index (BMI) 22.0-22.9, adult: Secondary | ICD-10-CM | POA: Diagnosis not present

## 2017-05-28 DIAGNOSIS — I639 Cerebral infarction, unspecified: Secondary | ICD-10-CM | POA: Diagnosis not present

## 2017-08-04 DIAGNOSIS — H40013 Open angle with borderline findings, low risk, bilateral: Secondary | ICD-10-CM | POA: Diagnosis not present

## 2017-08-04 DIAGNOSIS — E109 Type 1 diabetes mellitus without complications: Secondary | ICD-10-CM | POA: Diagnosis not present

## 2017-08-05 ENCOUNTER — Telehealth: Payer: Self-pay | Admitting: Adult Health

## 2017-08-05 NOTE — Telephone Encounter (Signed)
Pt's husband called to advise she is having issues with reasoning and forgetfulness. The appt was moved up from July to 5/6 per the husband's request

## 2017-08-06 DIAGNOSIS — L255 Unspecified contact dermatitis due to plants, except food: Secondary | ICD-10-CM | POA: Diagnosis not present

## 2017-08-06 DIAGNOSIS — E109 Type 1 diabetes mellitus without complications: Secondary | ICD-10-CM | POA: Diagnosis not present

## 2017-08-16 ENCOUNTER — Ambulatory Visit: Payer: PPO | Admitting: Adult Health

## 2017-08-17 ENCOUNTER — Encounter: Payer: Self-pay | Admitting: Adult Health

## 2017-11-09 ENCOUNTER — Ambulatory Visit: Payer: PPO | Admitting: Adult Health

## 2017-11-10 NOTE — Progress Notes (Signed)
[facility] [address] [place]. [place] [address] [phone]       OFFICE FOLLOW-UP NOTE  [name] Date of Birth:  April 21, 1960 Medical Record Number:  [id number]   HPI:  11/11/17 UPDATE: Patient is being seen today for routine follow-up and is accompanied by her husband.  She states she continues to do well but does have intermittent unsteadiness but she feels as though this could be more related to anxiety from previous strokes.  Patient is currently on Effexor 75 mg and recommended to speak to PCP in regards to possibly increasing this for continued anxiety complaints.  She recently has stopped taking Plavix due to increased price of $200 per month after switching insurance companies.  Patient was advised to call insurance company to investigate whether this is a continued monthly price or if she has a high deductible plan.  Patient was provided with good Rx coupon in order to receive Plavix from [facility] for $3 in the meantime.  She continues to take Lipitor without side effects of myalgias.  Patient will have PCP follow-up and lipid levels at follow-up appointment in August.  As she is a type I diabetic patient has insulin pump but states her glucose levels have been uncontrolled where they can be very low or very elevated.  She currently uses NovoLog in her insulin pump.  Patient is complaining of right shoulder pain that radiates down into her arm and her hand for which she has been experiencing for a couple years now.  She has seen Dr. Magnus Ivan in orthopedics prior where he ordered MRI but patient was unable to afford due to insurance issues.  Patient requesting MRI at this time but recommended to follow-up with Dr. Eliberto Ivory office.  Denies new or worsening stroke/TIA symptoms.   Visit 05/12/2017: Joyce Steele is a 57 year old female who is being seen today after a new stroke at [facility] on 02/16/17. All information obtained from patient and  chart.through care everywhere. All imaging's and lab results personally reviewed. 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  Pt felt she becameweak on the left side on 02/16/17 and started leaning towards the left as though "someone was pulling me down". Denies right sided weakness or facial droop. Denies speech difficulties or vision difficulties. MRI showed acute infarctions within right posterior frontal periventricular white matter and left cingulate subcortical white matter; MRI also showed several small chronic lacunar infarctions . CT scan negative . Carotid doppler negative for stenosis.transthoracic Echo negative for PFO. Pt was previously prescribed aspirin 325mg  and lipitor 40mg  but discontinued them (patient unsure the date of stopping these medications and also cannot state why she stopped taking them). LDL was 146 and it was recommended during hospitalization to to restart lipitor 40mg  and aspirin 325mg . She was  started cozaar due to hypertension. Pt is currently being follow by endocrinology for her type 1 diabetes in which she uses in insulin pump. Patient declined PT/OT services. She states she is able to walk well except her left leg occasionally causes some imbalance Since discharge, pt states her balance is improving but still slightly off. She also has complaints of fatigue. She quit smoking after the strokein Nov 2018. Pt states her DMI has not been in good control. She has an appointment tomorrow with her endocrinologist to adjust her insulin levels in which she currently uses  an insulin pump. Pt continues to take her lipitor 40mg  without side effects. Pt continues to take aspirin 325mg  and plavix 75mg  (which was prescribed at hospital discharge) at this time. Denies side effects such as upset stomach, increased bruising or increased bleeding. No recent increase in TIA/stroke symptoms.    Previous visit 08/06/15 Joyce Steele is an 57 y.o. female, right handed, with a past medical history significant for DM type 1, RA, smoking, admitted to [facility] for further evaluation of numbness right face-arm. Stated that she never had similar symptoms before. Went to bed 08/14/14 night feeling well but woke up around 8:45 next morning with a numb sensation involving her right face including right side of her lips and tongue as well as the right arm. (LKW 08/14/2014, time unknown). She denied associated HA, vertigo, double vision, difficulty swallowing, focal weakness, unsteadiness, confusion, slurred speech, language or vision impairment. She indicated that her symptoms did not get better and drove to her endocrinologist office who sent her to [facility] for urgent MRI brain. I did review her brain MRI and there is evidence of an acute left lacunar infarct. Joyce Steele said that her symptoms remain unchanged. Patient was not administered TPA secondary to delay in arrival. She was admitted for further evaluation and treatment. MRI scan of the brain showed acute nonhemorrhagic subcentimeter infarct involving the left thalamus which could explain her sensory symptoms. MRA of the brain showed moderate mid to distal posterior cerebral artery irregularity but no large vessel stenosis. Carotid ultrasound showed no significant stenosis transversely echo showed normal ejection fraction. LDL cholesterol is elevated and 153 and hemoglobin A1c at 6.9. Patient was started on Lipitor 40 Grams daily and aspirin as well as blood pressure medications. She states she's done well since discharge and numbness on the right side of the face as well as hand is reduced but it is still present. She is at a flareup and arthritis. She does not work as she is on disability from her rheumatoid arthritis. She had repeat lipid profile checked by her primary physician and it was fine last month. She remains on aspirin which is tolerating well with only  minor bruising and no bleeding episodes. She has no new neurological complaints. Update 08/06/2015 : She returns for follow-up after last visit 6 months ago. He is doing well from the stroke standpoint without recurrent stroke or TIAs. She still has some numbness around the right thumb which is not bothersome. She is remains on aspirin but does complain of bruising in her legs. She has had no bleeding problems. She is tolerating Lipitor well without any myalgias or arthralgias. She states her blood pressure is well controlled on metoprolol and today it is 129/81 office. She states her rheumatoid arthritis also appears to be quite well controlled and she sees Dr. Dierdre Forth for that. She has a new complaint today of right lateral neck pain which radiates down into her right thumb. This is a chronic problem upcoming for years and occurs daily. Neck movements with a right painful. At times she has trouble sleeping because of the pain. She has had extensive evaluation for this 5 years ago including x-rays and MRI scan. She was told she had a bulging disc and she saw Dr. Magnus Ivan from orthopedics .She refused surgery and had epidural spinal injections but without relief. She is currently not taking any medication for this particular pain though in the past she has tried gabapentin which has not worked well.   ROS:  14 system review of systems is positive for eye itching, diarrhea, insomnia, painful urination, incontinence of bladder, frequency of urination, urgency, joint pain, aching muscles, neck pain, rash, bruise easily and all other systems negative.   PMH:  Past Medical History:  Diagnosis Date  . Arthritis   . Diabetes mellitus without complication (HCC)   . Rheumatoid arthritis (HCC)   . Stroke [facility])     Social History:  Social History   Socioeconomic History  . Marital status: Legally Separated    Spouse name: Not on file  . Number of children: Not on file  . Years of education: Not on file   . Highest education level: Not on file  Occupational History  . Not on file  Social Needs  . Financial resource strain: Not on file  . Food insecurity:    Worry: Not on file    Inability: Not on file  . Transportation needs:    Medical: Not on file    Non-medical: Not on file  Tobacco Use  . Smoking status: Former Smoker    Packs/day: 0.25    Last attempt to quit: 03/01/2017    Years since quitting: 0.6  . Smokeless tobacco: Never Used  . Tobacco comment: pt smokes 4 cigarettes per day  Substance and Sexual Activity  . Alcohol use: No    Alcohol/week: 0.0 oz  . Drug use: No  . Sexual activity: Not on file  Lifestyle  . Physical activity:    Days per week: Not on file    Minutes per session: Not on file  . Stress: Not on file  Relationships  . Social connections:    Talks on phone: Not on file    Gets together: Not on file    Attends religious service: Not on file    Active member of club or organization: Not on file    Attends meetings of clubs or organizations: Not on file    Relationship status: Not on file  . Intimate partner violence:    Fear of current or ex partner: Not on file    Emotionally abused: Not on file    Physically abused: Not on file    Forced sexual activity: Not on file  Other Topics Concern  . Not on file  Social History Narrative  . Not on file    Medications:   Current Outpatient Medications on File Prior to Visit  Medication Sig Dispense Refill  . atorvastatin (LIPITOR) 40 MG tablet Take 1 tablet (40 mg total) by mouth daily at 6 PM. 30 tablet 0  . insulin aspart (NOVOLOG) 100 UNIT/ML injection See admin instructions. Per insulin pump    . LORazepam (ATIVAN) 0.5 MG tablet every 6 (six) hours as needed.   2  . losartan (COZAAR) 25 MG tablet Take by mouth.    . metoprolol succinate (TOPROL-XL) 25 MG 24 hr tablet TAKE 1 TABLET BY MOUTH at bedtime for blood pressure and pulse control  0  . venlafaxine XR (EFFEXOR-XR) 75 MG 24 hr capsule  Take 75 mg by mouth daily with breakfast.    . zolpidem (AMBIEN CR) 12.5 MG CR tablet Take 12.5 mg by mouth at bedtime.     . mometasone (ELOCON) 0.1 % cream APP SMALL AMOUNT AA D  2   No current facility-administered medications on file prior to visit.     Allergies:   Allergies  Allergen Reactions  . Shellfish Allergy Anaphylaxis and Swelling  . Lyrica [Pregabalin] Swelling  Made the patient's feet swell    Physical Exam General: well developed, well nourished middle-aged Caucasian lady seated, in no evident distress Head: head normocephalic and atraumatic.  Neck: supple with no carotid or supraclavicular bruits Cardiovascular: regular rate and rhythm, no murmurs Musculoskeletal: no deformity Skin:  no rash/petichiae Vascular:  Normal pulses all extremities Vitals:   11/11/17 0959  BP: 131/79  Pulse: 79   Neurologic Exam Mental Status: Awake and fully alert. Oriented to place and time. Recent and remote memory intact. Attention span, concentration and fund of knowledge appropriate. Mood and affect appropriate.  Cranial Nerves: Fundoscopic exam reveals sharp disc margins. Pupils equal, briskly reactive to light. Extraocular movements full without nystagmus. Visual fields full to confrontation. Hearing intact. Facial sensation intact. Face, tongue, palate moves normally and symmetrically.  Motor: Normal bulk and tone. Slight weakness in bilateral hip flexors. All other extremities 5/5 strength. Fine finger movements are diminished on the left Sensory.: intact to touch ,pinprick .position and vibratory sensation. Mild subjective restoration as around the right thumb but no objective sensory loss Coordination: Rapid alternating movements decreased in left hand. Orbiting present around left arm. Finger-to-nose and heel-to-shin performed accurately bilaterally.  Gait and Station: Arises from chair without difficulty. Stance is normal. Gait demonstrates normal stride length and balance  . Able to heel, toe and tandem walk without difficulty.  Reflexes: 1+ and symmetric. Toes downgoing.      ASSESSMENT: 59 year Caucasian lady with left thalamic infarct in May 2016 with recent right frontal white matter infarct and left subcortical whiter infarct on 02/16/17 secondary to small vessel disease with vascular risk factors of hypertension, diabetes and hyperlipidemia. Patient returns today for follow-up visit and overall is doing well from a stroke standpoint.    PLAN: -Continue clopidogrel 75 mg daily  and lipitor  for secondary stroke prevention - refill Plavix sent to [facility] pharmacy the patient was advised that PCP we will continue to prescribe in the future -Patient was provided with good Rx coupon in order to be able to afford Plavix along with recommendation of calling insurance company in order to better no future prices of this medication -Referral placed for endocrinology due to patient having difficulty managing glucose levels.  As her previous strokes were due to small vessel disease, the importance was stressed to patient the need for tight glycemic control and highly recommended being followed by endocrinology -F/u with PCP regarding your HLD and HTN management along with possible increase of Effexor due to ongoing anxiety -continue to monitor BP at home -Advised to schedule appointment with Dr. Magnus Ivan for interventions regarding right shoulder pain and possible imaging with MRI as previously scheduled but unable to obtain due to insurance reasons -Patient was advised to continue to stay active and maintain a healthy diet -Maintain strict control of hypertension with blood pressure goal below 130/90, diabetes with hemoglobin A1c goal below 6.5% and cholesterol with LDL cholesterol (bad cholesterol) goal below 70 mg/dL. I also advised the patient to eat a healthy diet with plenty of whole grains, cereals, fruits and vegetables, exercise regularly and maintain ideal  body weight.  Follow up as needed as patient is stable from stroke standpoint or call earlier if needed  Greater than 50% of time during this 25 minute visit was spent on counseling,explanation of diagnosis of lacunar infarcts, reviewing risk factor management of HLD, HTN and DM, planning of further management, discussion with patient and family and coordination of care   George Hugh, AGNP-BC  [facility] [address] [place], [place] [address]  Phone [phone] Fax [phone] Note: This document was prepared with digital dictation and possible smart phrase technology. Any transcriptional errors that result from this process are unintentional.

## 2017-11-11 ENCOUNTER — Ambulatory Visit (INDEPENDENT_AMBULATORY_CARE_PROVIDER_SITE_OTHER): Payer: Medicare HMO | Admitting: Adult Health

## 2017-11-11 ENCOUNTER — Encounter: Payer: Self-pay | Admitting: Adult Health

## 2017-11-11 VITALS — BP 131/79 | HR 79 | Wt 128.0 lb

## 2017-11-11 DIAGNOSIS — E109 Type 1 diabetes mellitus without complications: Secondary | ICD-10-CM

## 2017-11-11 DIAGNOSIS — I6381 Other cerebral infarction due to occlusion or stenosis of small artery: Secondary | ICD-10-CM | POA: Diagnosis not present

## 2017-11-11 MED ORDER — CLOPIDOGREL BISULFATE 75 MG PO TABS: 75.0000 mg | ORAL_TABLET | Freq: Every day | ORAL | 11 refills | Status: DC

## 2017-11-11 NOTE — Patient Instructions (Signed)
Continue clopidogrel 75 mg daily  and lipitor  for secondary stroke prevention  Continue to follow up with PCP regarding cholesterol and blood pressure management   You will be called to schedule endocrinology appointment for diabetic management  Continue to stay active and maintain a healthy diet  Maintain strict control of hypertension with blood pressure goal below 130/90, diabetes with hemoglobin A1c goal below 6.5% and cholesterol with LDL cholesterol (bad cholesterol) goal below 70 mg/dL. I also advised the patient to eat a healthy diet with plenty of whole grains, cereals, fruits and vegetables, exercise regularly and maintain ideal body weight.  Followup in the future with me as needed or call earlier if needed         Thank you for coming to see [place] at [facility]. I hope we have been able to provide you high quality care today.  You may receive a patient satisfaction survey over the next few weeks. We would appreciate your feedback and comments so that we may continue to improve ourselves and the health of our patients.

## 2017-11-25 NOTE — Progress Notes (Signed)
I agree with the above plan 

## 2018-01-25 ENCOUNTER — Encounter: Payer: Self-pay | Admitting: Endocrinology

## 2019-03-20 IMAGING — CT CT HEAD W/O CM
4 series · 15 of 47 positions shown, 17 images · non-contrast
Comparison: 08/15/2014 brain MRI

CLINICAL DATA: Right-sided facial numbness.  Unsteady gait.

EXAM:
CT HEAD WITHOUT CONTRAST
TECHNIQUE: Contiguous axial images were obtained from the base of the skull
through the vertex without intravenous contrast.

[Series 3: head without · axial · non-contrast · 0.44mm/px · z∈[-79,+41]mm · 7 of 32 slices shown, 9 images]
[im 4/32  brain]
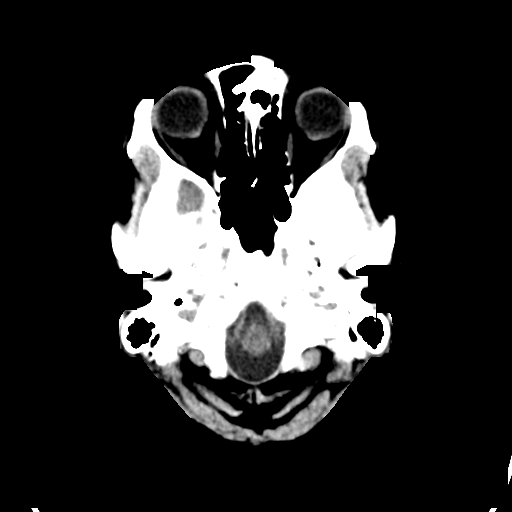
[im 4/32  bone]
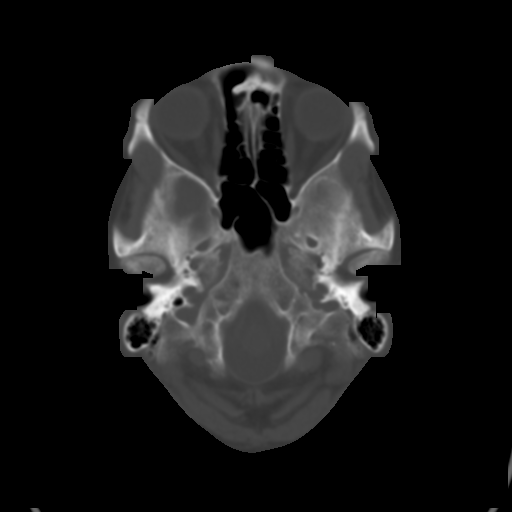
[im 8/32  brain]
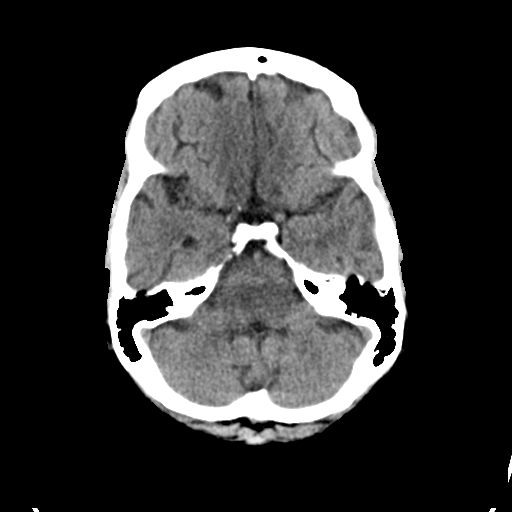
[im 12/32  brain]
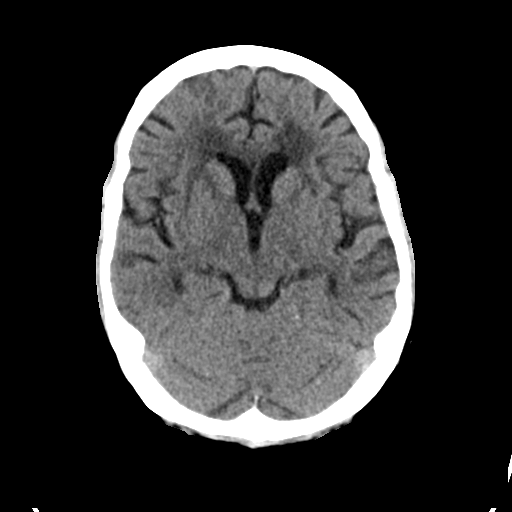
[im 16/32  brain]
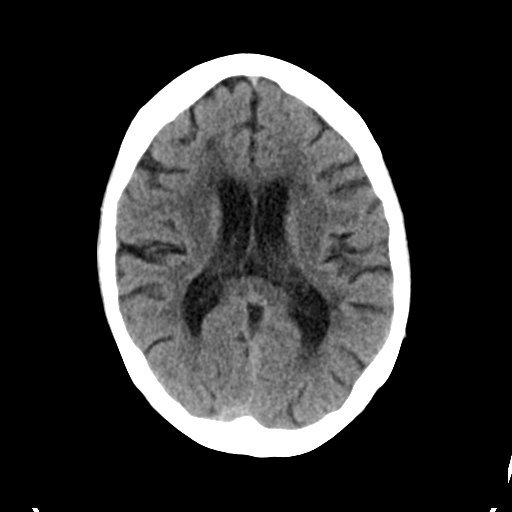
[im 20/32  brain]
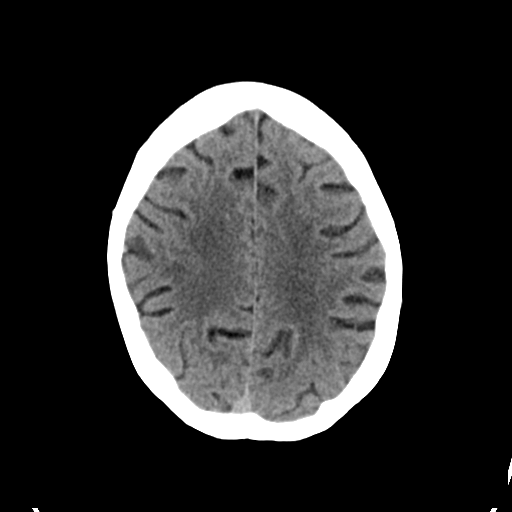
[im 20/32  bone]
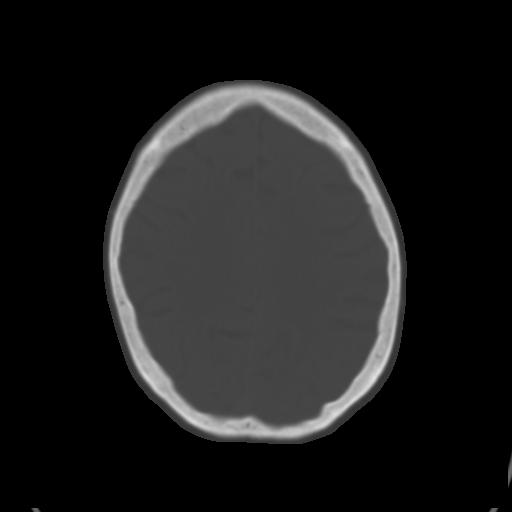
[im 24/32  brain]
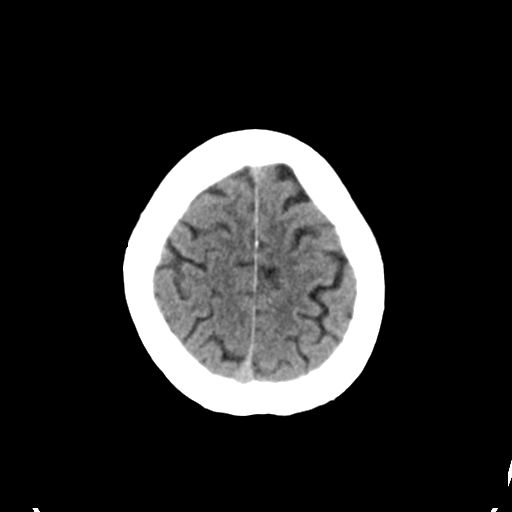
[im 28/32  brain]
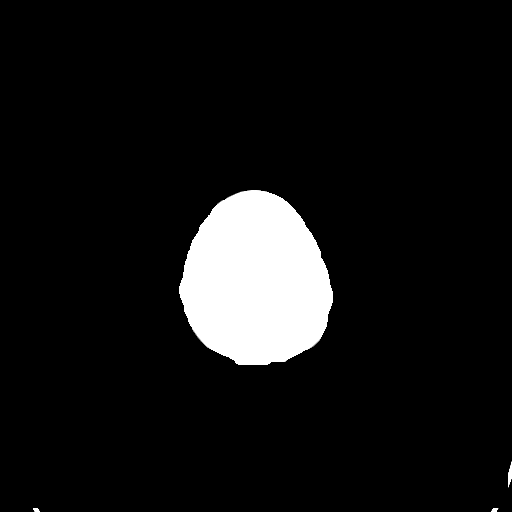

[Series 4: head bone · axial · 0.44mm/px · z∈[-80,-64]mm · 2 of 78 slices shown]
[im 8/78  bone]
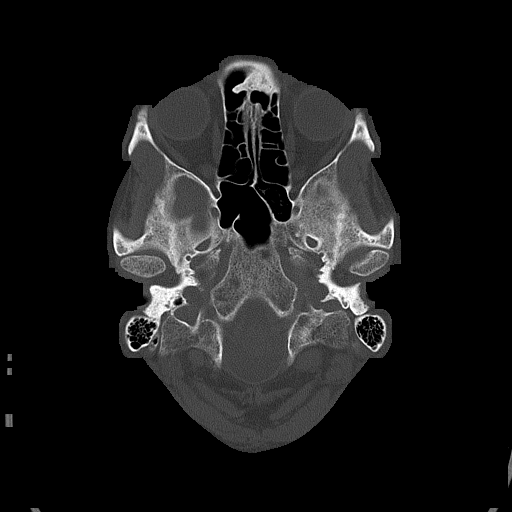
[im 16/78  bone]
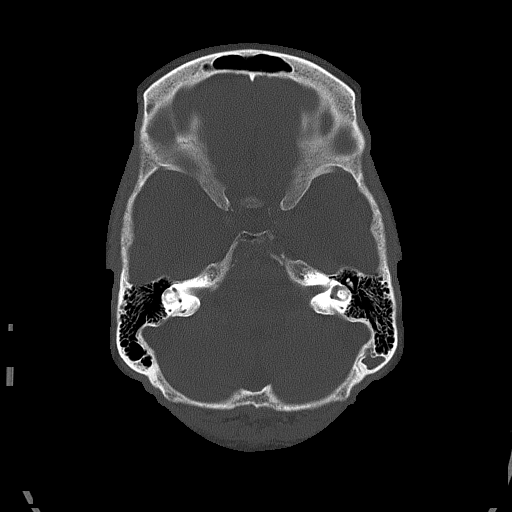

[Series 5: head without cor · coronal · non-contrast · 0.31mm/px · 3 of 67 slices shown]
[im 23/67  brain]
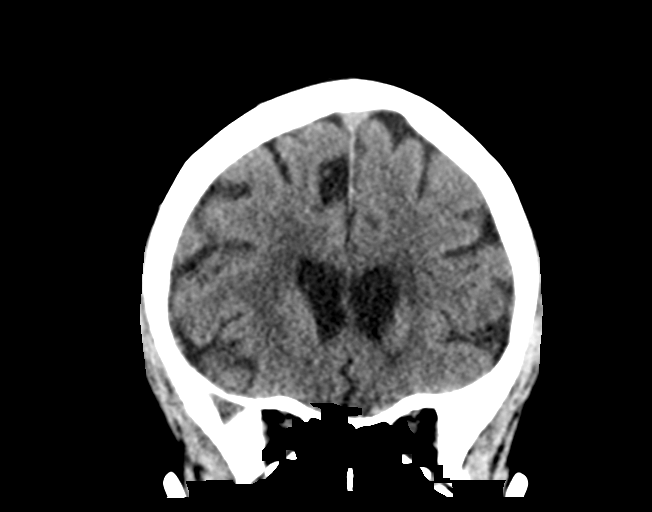
[im 30/67  brain]
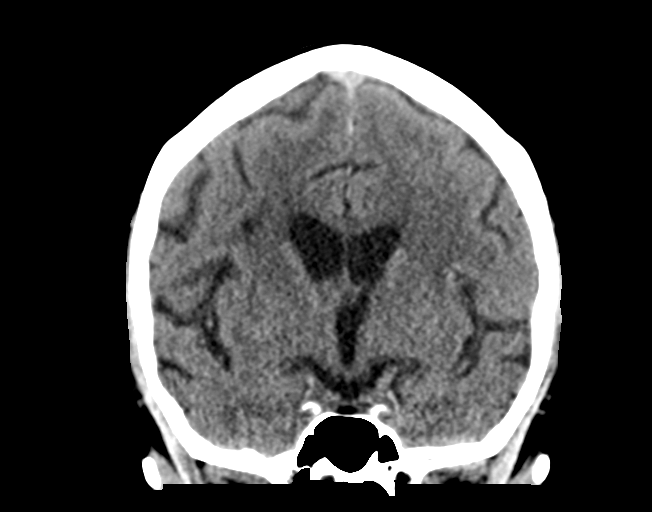
[im 37/67  brain]
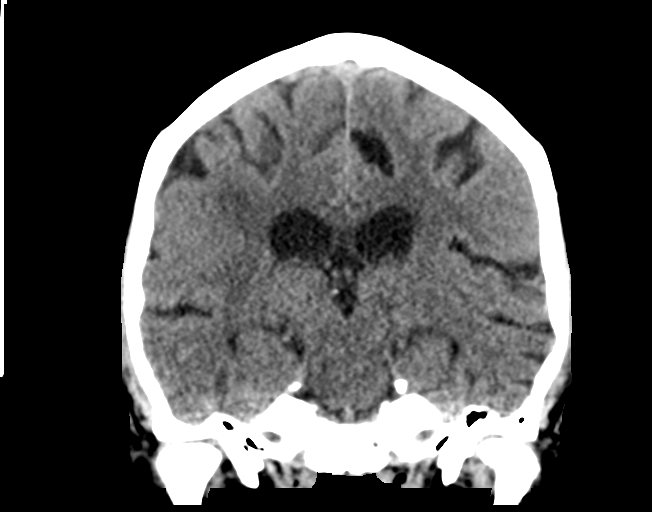

[Series 6: head without sag · sagittal · non-contrast · 0.31mm/px · 3 of 67 slices shown]
[im 23/67  brain]
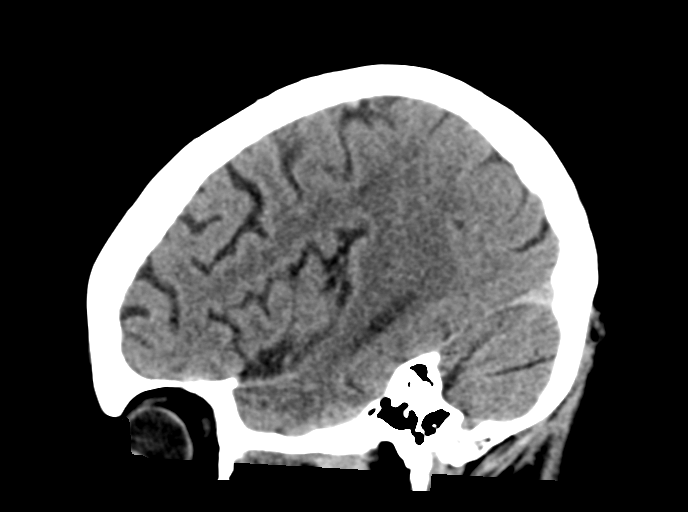
[im 34/67  brain]
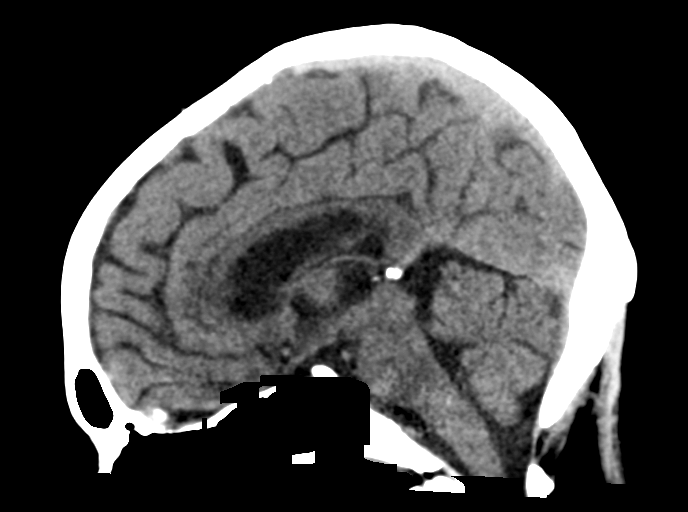
[im 45/67  brain]
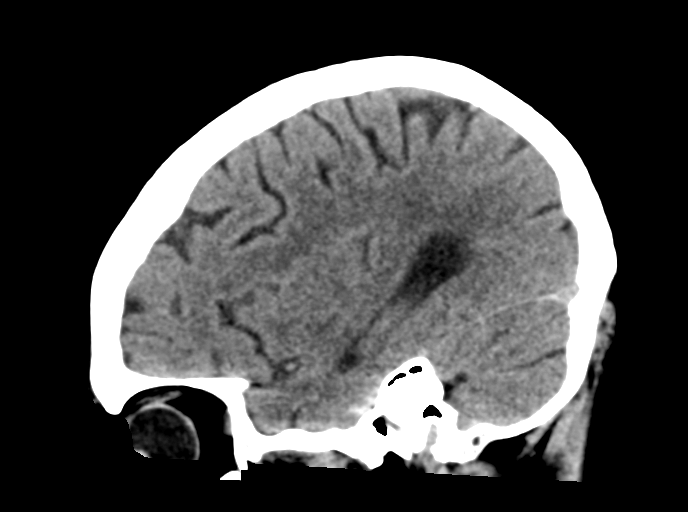

[15 of 47 positions shown; findings below may reference images not displayed]

FINDINGS: Brain: There is a 1 cm low-density in the central right thalamus. A
lacunar infarct is seen in this location on 7461 brain MRI, but the
current abnormality is larger. More subtle ovoid low-density in the
lower left thalamus. Confluent low-density in the deep cerebral
white matter with right corona radiata lacune, advanced for age
chronic small vessel disease. No hemorrhage, signs of cortical
infarct, or hydrocephalus. No masslike findings.

Vascular: Mild arterial calcification.  No hyperdense vessel.

Skull: No acute or aggressive finding

Sinuses/Orbits: Frothy secretions seen in the patent left sphenoid
sinus.
IMPRESSION: Age-indeterminate bilateral thalamus infarcts. Chronic ischemia was
seen in the thalami on 7461 brain MRI, but the current findings are
more extensive. Chronic small-vessel disease is advanced and diffuse
in the deep cerebral white matter.

## 2019-03-20 IMAGING — MR MR HEAD W/O CM
9 of 10 series · 38 of 48 positions shown · non-contrast
Comparison: Head CT 09/17/2016

CLINICAL DATA: Right-sided facial numbness

EXAM:
MRI HEAD WITHOUT CONTRAST
TECHNIQUE: Multiplanar, multiecho pulse sequences of the brain and surrounding
structures were obtained without intravenous contrast.

[Series 3: DWI · axial · 3.0mm · 1.09mm/px · z∈[-105,+21]mm · 11 of 90 slices shown (1 of 4)]
[im 1/90]
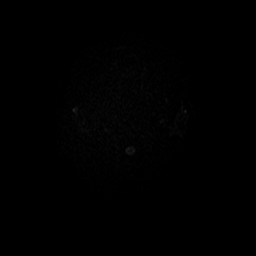
[im 9/90]
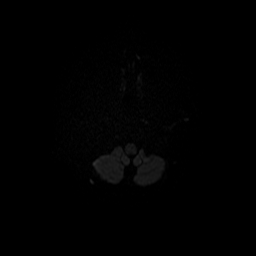
[im 18/90]
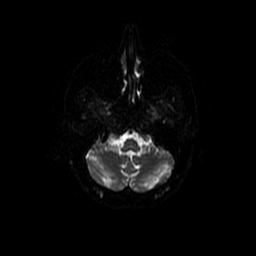
[im 27/90]
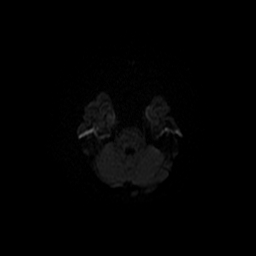
[im 36/90]
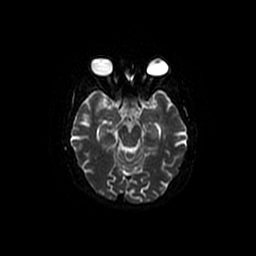
[im 45/90]
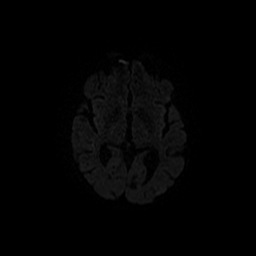
[im 54/90]
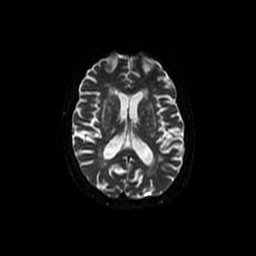
[im 63/90]
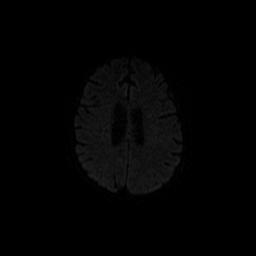
[im 72/90]
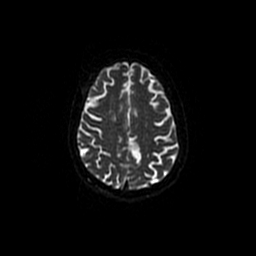
[im 81/90]
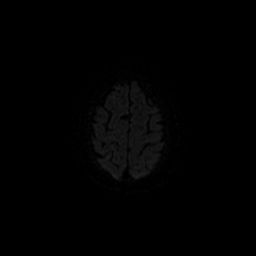
[im 90/90]
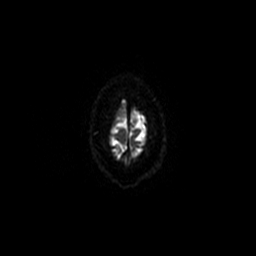

[Series 4: T2 · axial · 5.0mm · 0.47mm/px · z∈[-97,+28]mm · 2 of 23 slices shown (1 of 2)]
[im 1/23]
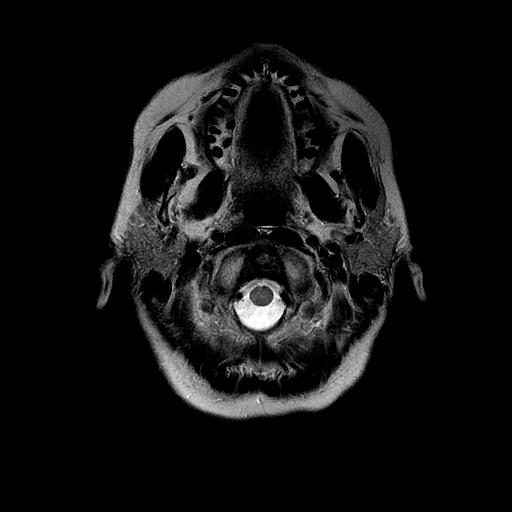
[im 23/23]
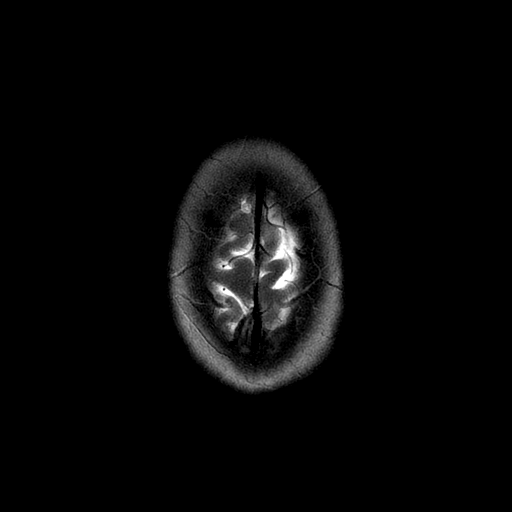

[Series 5: DWI · coronal · 5.0mm · 1.09mm/px · 7 of 68 slices shown (2 of 4)]
[im 1/68]
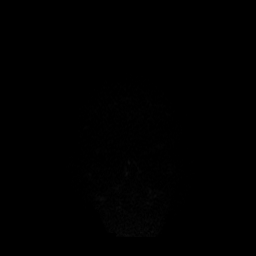
[im 12/68]
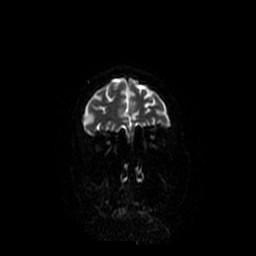
[im 23/68]
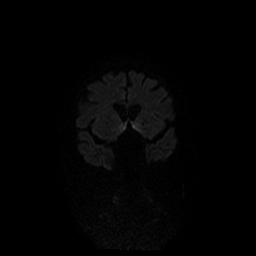
[im 34/68]
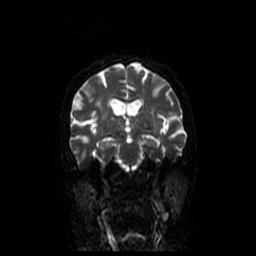
[im 45/68]
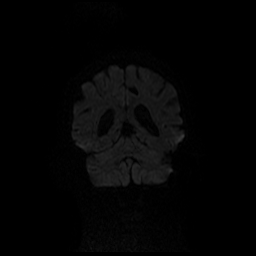
[im 56/68]
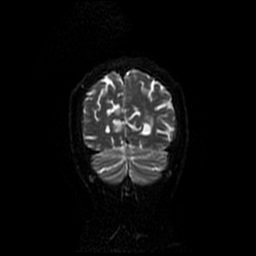
[im 68/68]
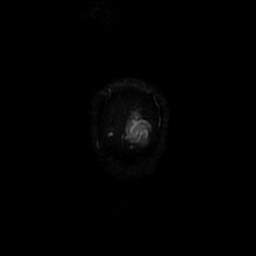

[Series 6: ax mpgr · axial · 5.0mm · 0.47mm/px · z∈[-97,+28]mm · 2 of 23 slices shown]
[im 1/23]
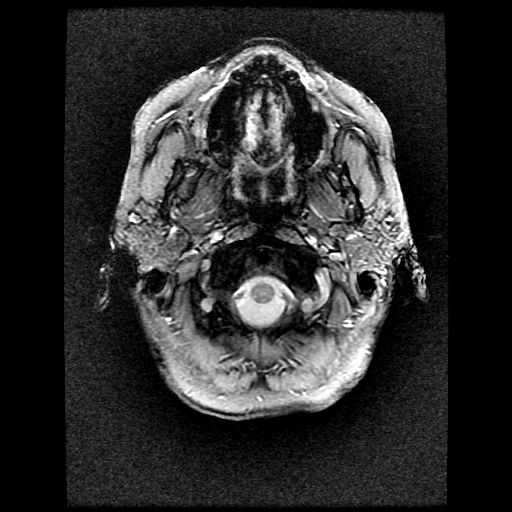
[im 23/23]
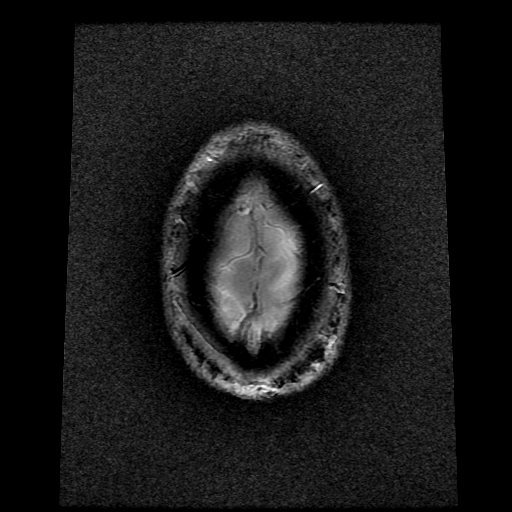

[Series 7: FLAIR · axial · 5.0mm · 0.47mm/px · z∈[-97,+28]mm · 2 of 23 slices shown]
[im 1/23]
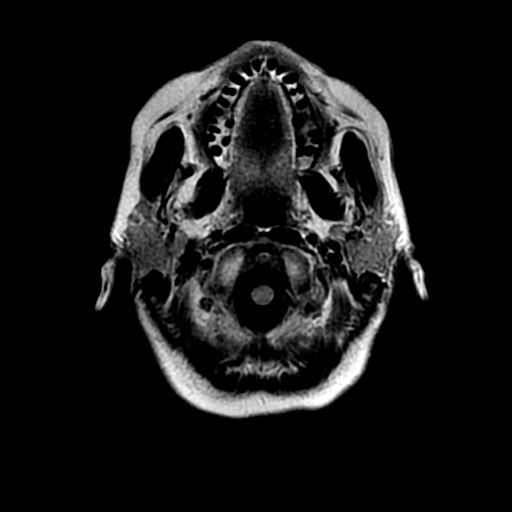
[im 23/23]
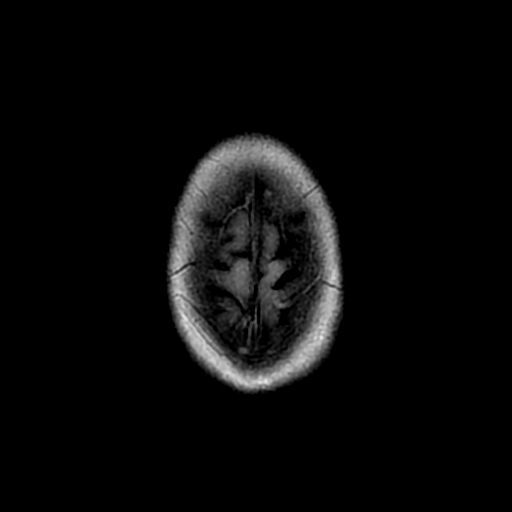

[Series 8: T1 · sagittal · 5.0mm · 0.47mm/px · 2 of 23 slices shown]
[im 1/23]
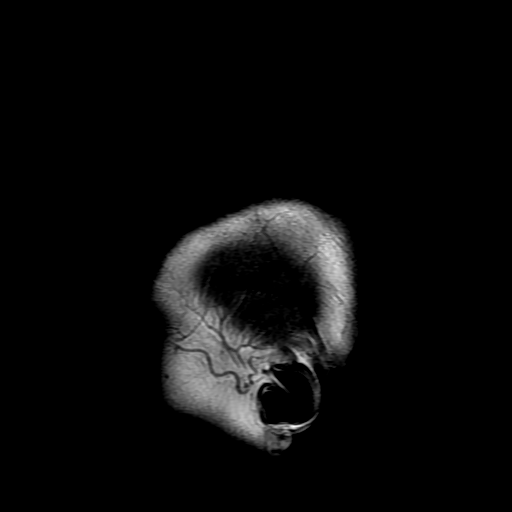
[im 23/23]
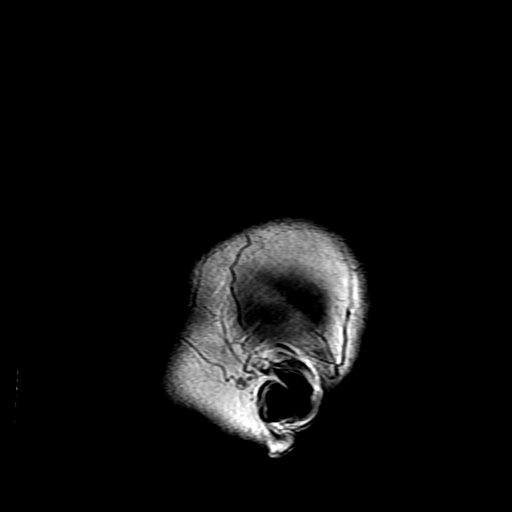

[Series 10: T2 · coronal · 5.0mm · 0.43mm/px · 3 of 29 slices shown (2 of 2)]
[im 1/29]
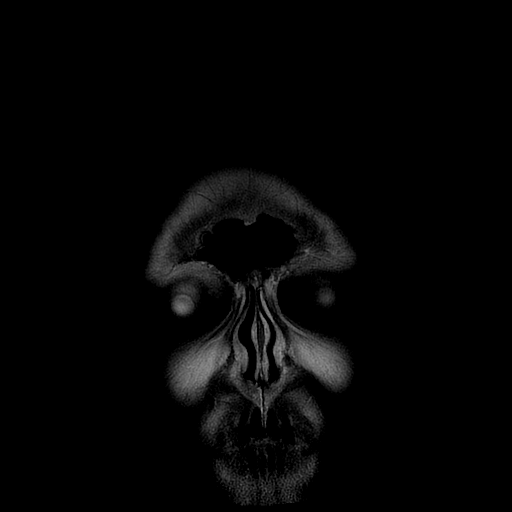
[im 15/29]
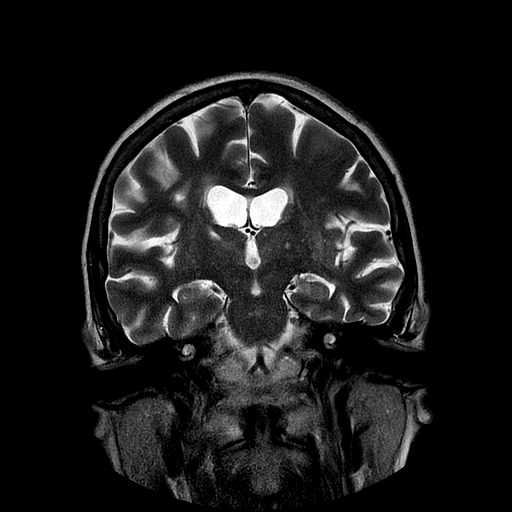
[im 29/29]
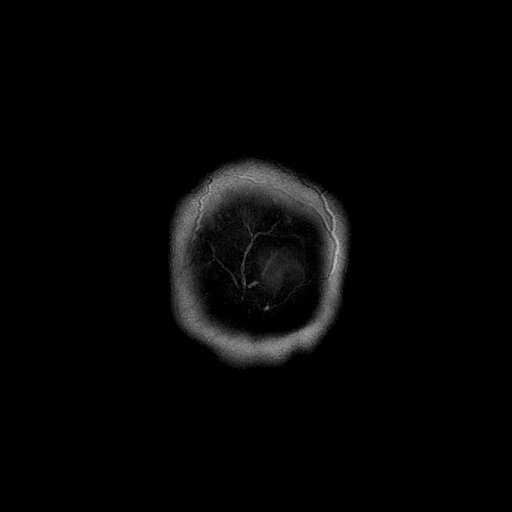

[Series 300: DWI · axial · 3.0mm · 1.09mm/px · z∈[-105,+21]mm · 5 of 45 slices shown (3 of 4)]
[im 1/45]
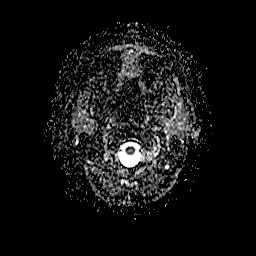
[im 12/45]
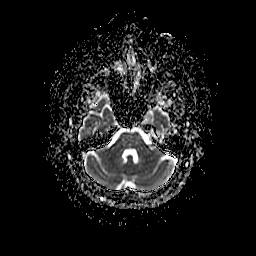
[im 23/45]
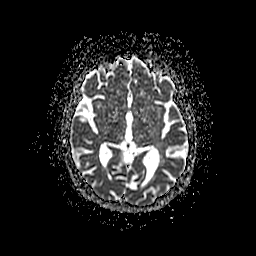
[im 34/45]
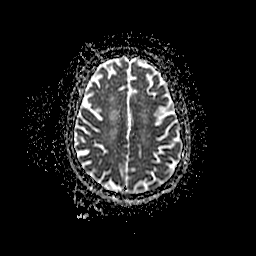
[im 45/45]
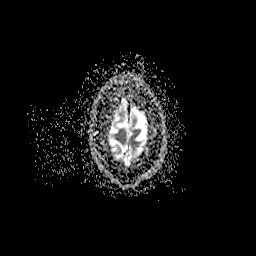

[Series 500: DWI · coronal · 5.0mm · 1.09mm/px · 4 of 34 slices shown (4 of 4)]
[im 1/34]
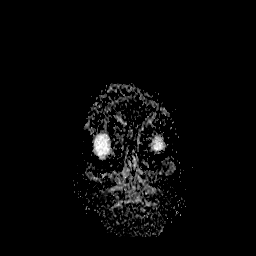
[im 12/34]
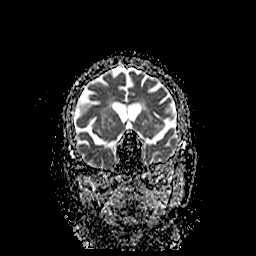
[im 23/34]
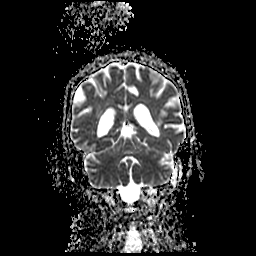
[im 34/34]
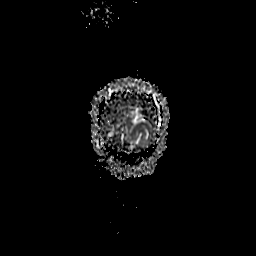

[38 of 48 positions shown; findings below may reference images not displayed]

FINDINGS: Brain: The midline structures are normal. There is no focal
diffusion restriction to indicate acute infarct. Old bilateral
thalamic and corona radiata lacunar infarcts. There is early
confluent hyperintense T2-weighted signal within the periventricular
and deep white matter, most often seen in the setting of chronic
microvascular ischemia. Brain volume is normal for age without
age-advanced or lobar predominant atrophy. The dura is normal and
there is no extra-axial collection. Foci of chronic microhemorrhage
in the right basal ganglia and right parietal lobe. No acute
hemorrhage.

Vascular: Major intracranial arterial and venous sinus flow voids
are preserved.

Skull and upper cervical spine: The visualized skull base,
calvarium, upper cervical spine and extracranial soft tissues are
normal.

Sinuses/Orbits: No fluid levels or advanced mucosal thickening. No
mastoid effusion. Normal orbits.
IMPRESSION: Findings of chronic microvascular ischemia and multiple old lacunar
infarcts without acute intracranial abnormality.

## 2019-09-18 ENCOUNTER — Encounter: Payer: Self-pay | Admitting: Orthopaedic Surgery

## 2019-09-18 ENCOUNTER — Ambulatory Visit: Payer: Medicare HMO | Admitting: Orthopaedic Surgery

## 2019-09-18 ENCOUNTER — Other Ambulatory Visit: Payer: Self-pay

## 2019-09-18 ENCOUNTER — Ambulatory Visit (INDEPENDENT_AMBULATORY_CARE_PROVIDER_SITE_OTHER): Payer: Medicare HMO

## 2019-09-18 DIAGNOSIS — M542 Cervicalgia: Secondary | ICD-10-CM | POA: Diagnosis not present

## 2019-09-18 DIAGNOSIS — M4807 Spinal stenosis, lumbosacral region: Secondary | ICD-10-CM

## 2019-09-18 NOTE — Progress Notes (Signed)
Office Visit Note   Patient: Joyce Steele. Helene Kelp           Date of Birth: 01-Feb-1961           MRN: [id number] Visit Date: 09/18/2019              Requested by: Reynold Bowen, MD [place],  [place] [address] PCP: Reynold Bowen, MD   Assessment & Plan: Visit Diagnoses:  1. Cervicalgia     Plan: Given her x-ray findings and her clinical exam findings it is absolutely medically necessary and essentially obtain an MRI of the cervical spine to rule out nerve compression.  Also given the severity of her arthritic findings on plain films and how this is worsening and leading to a kyphosis of her cervical spine when compared to previous films, this necessitates a MRI of the cervical spine.  She agrees with this treatment plan.  We will work on getting this ordered and then will go from there.  Follow-Up Instructions: Return in about 2 weeks (around 10/02/2019).   Orders:  Orders Placed This Encounter  Procedures  . XR Cervical Spine 2 or 3 views   No orders of the defined types were placed in this encounter.     Procedures: No procedures performed   Clinical Data: No additional findings.   Subjective: Chief Complaint  Patient presents with  . Neck - Pain  The patient is someone of actually seen before although she is listed as a new patient.  She has worsening cervical spine pain with radicular symptoms going down into her right dominant upper extremity.  She has weakness in her right upper extremity as well as numbness and tingling.  Several years ago we tried to obtain an MRI of her lumbar spine and she was denied this from her insurance.  She has been through physical therapy for her spine.  She cannot take anti-inflammatories now due to the fact that she is on Plavix.  She still reports severe right-sided neck pain and significant radicular symptoms going down her right upper extremity.  She is now dropping things.  At this point she has tried and failed conservative  treatment for 4 years or more.  HPI  Review of Systems She does report headache.  She denies any chest pain or shortness of breath.  She denies any fever, chills, nausea, vomiting  Objective: Vital Signs: There were no vitals taken for this visit.  Physical Exam She is alert and orient x3 and in no acute distress Ortho Exam Examination of her cervical spine shows a positive Spurling sign to the right side.  She has pain and stiffness with lateral rotation and bending only to the right side.  Examination of her bilateral upper extremities shows significantly weak triceps function on the right side as well as grip strength on the right side comparing the right and left sides.  There is also numbness in the C5 and C6 distribution on the right arm only. Specialty Comments:  No specialty comments available.  Imaging: XR Cervical Spine 2 or 3 views  Result Date: 09/18/2019 2 views of the cervical spine are compared to the previous films from 2017.  There is severe degenerative disc disease between C5 and C7.  There is loss of the lumbar lordosis and now a kyphosis that was not seen previously.    PMFS History: Patient Active Problem List   Diagnosis Date Noted  . Stroke ([facility]) 08/15/2014  . Rheumatoid arthritis ([facility]) 08/15/2014  .  Diabetes mellitus type 1, controlled (HCC) 08/15/2014  . Tobacco abuse 08/15/2014   Past Medical History:  Diagnosis Date  . Arthritis   . Diabetes mellitus without complication (HCC)   . Rheumatoid arthritis (H[facility])   . Stroke [facility])     Family History  Problem Relation Age of Onset  . Stroke Mother 41  . Rheum arthritis Mother   . Arthritis Mother   . Hypertension Father   . Thyroid disease Father   . Stroke Maternal Grandfather   . Stroke Paternal Grandfather     Past Surgical History:  Procedure Laterality Date  . HAND SURGERY     Social History   Occupational History  . Not on file  Tobacco Use  . Smoking status: Former Smoker     Packs/day: 0.25    Quit date: 03/01/2017    Years since quitting: 2.5  . Smokeless tobacco: Never Used  . Tobacco comment: pt smokes 4 cigarettes per day  Substance and Sexual Activity  . Alcohol use: No    Alcohol/week: 0.0 standard drinks  . Drug use: No  . Sexual activity: Not on file

## 2019-10-04 ENCOUNTER — Ambulatory Visit: Payer: PPO | Admitting: Orthopaedic Surgery

## 2019-10-09 ENCOUNTER — Telehealth: Payer: Self-pay | Admitting: Orthopaedic Surgery

## 2019-10-09 NOTE — Telephone Encounter (Signed)
Patient's husband Ramon Dredge called advised patient hurt her right shoulder lifting a heavy bucket watering flowers. Edward asked if patient can have her shoulder looked at when she have the MRI on her neck/back. Ramon Dredge said patient is not using her right arm much. The number to contact Ramon Dredge is [phone]

## 2019-10-09 NOTE — Telephone Encounter (Signed)
Husband aware we can't just add this without seeing her in the office. He states it is actually getting some better

## 2019-10-19 ENCOUNTER — Other Ambulatory Visit: Payer: Self-pay

## 2019-10-19 ENCOUNTER — Ambulatory Visit
Admission: RE | Admit: 2019-10-19 | Discharge: 2019-10-19 | Disposition: A | Discharge status: Home / Self Care | Payer: Medicare HMO | Source: Ambulatory Visit | Attending: Orthopaedic Surgery | Admitting: Orthopaedic Surgery

## 2019-10-19 DIAGNOSIS — M4807 Spinal stenosis, lumbosacral region: Secondary | ICD-10-CM

## 2019-10-23 ENCOUNTER — Other Ambulatory Visit: Payer: Self-pay

## 2019-10-23 ENCOUNTER — Ambulatory Visit: Payer: Medicare HMO | Admitting: Orthopaedic Surgery

## 2019-10-23 ENCOUNTER — Encounter: Payer: Self-pay | Admitting: Orthopaedic Surgery

## 2019-10-23 DIAGNOSIS — M542 Cervicalgia: Secondary | ICD-10-CM

## 2019-10-23 DIAGNOSIS — M5412 Radiculopathy, cervical region: Secondary | ICD-10-CM | POA: Diagnosis not present

## 2019-10-23 NOTE — Progress Notes (Signed)
The patient comes in today to go over an MRI of her cervical spine.  Obtain this MRI due to continued neck pain with radicular symptoms going down her right arm.  She does have a history of a stroke and is on Plavix.  She is only 59 years old.  I have actually performed bilateral carpal tunnel releases on her about 15 years ago.  She has never had any issues in the hands until her stroke.  She is planning to see her neurologist soon.  I did send her for the MRI of her cervical spine given the plain film findings showing a kyphosis of the mid cervical spine as well.  The MRI is reviewed with her and does show some foraminal stenosis bilaterally that see 6 C7 and at 2 levels above that at C5-6 and C4-5 there is arthritic changes as well.  There is no gross neurologic compromise that is significant but is certainly mild to moderate.  I agree with her seeing her neurologist because this may be chronic residual aspects of her stroke.  I would also like to send her to neurosurgery for further evaluation of this foraminal stenosis to see if they feel that this is more likely what her symptoms are coming from to help develop the best treatment plan for her.  All questions and concerns were answered and addressed.  We will work on making those referrals.

## 2019-10-24 ENCOUNTER — Other Ambulatory Visit: Payer: Self-pay

## 2019-10-24 DIAGNOSIS — M542 Cervicalgia: Secondary | ICD-10-CM

## 2019-10-24 DIAGNOSIS — M5412 Radiculopathy, cervical region: Secondary | ICD-10-CM

## 2019-11-08 ENCOUNTER — Other Ambulatory Visit: Payer: Self-pay

## 2019-11-08 ENCOUNTER — Encounter: Payer: Self-pay | Admitting: Adult Health

## 2019-11-08 ENCOUNTER — Ambulatory Visit: Payer: Medicare HMO | Admitting: Adult Health

## 2019-11-08 VITALS — BP 146/77 | HR 77 | Ht 64.0 in | Wt 130.0 lb

## 2019-11-08 DIAGNOSIS — R269 Unspecified abnormalities of gait and mobility: Secondary | ICD-10-CM | POA: Diagnosis not present

## 2019-11-08 DIAGNOSIS — Z8673 Personal history of transient ischemic attack (TIA), and cerebral infarction without residual deficits: Secondary | ICD-10-CM | POA: Diagnosis not present

## 2019-11-08 DIAGNOSIS — M542 Cervicalgia: Secondary | ICD-10-CM

## 2019-11-08 DIAGNOSIS — I69398 Other sequelae of cerebral infarction: Secondary | ICD-10-CM | POA: Diagnosis not present

## 2019-11-08 NOTE — Progress Notes (Signed)
[facility] [address] [place]. [place] [address] [phone]       OFFICE FOLLOW-UP NOTE  [name] Date of Birth:  01-Jan-1961 Medical Record Number:  [id number]    Chief complaint: Chief Complaint  Patient presents with  . Follow-up    Rm 5 here for a stroke f/u. Pt says she has been more off balance since the stroke. Pt says this has been happening the past 6 months and has fallen 3-4x.      HPI:   Today, 11/08/2019, [name] returns per patient request due to 47-month gradual worsening of imbalance.  She is accompanied by her husband.  Post stroke imbalance/unsteadiness which was discussed at prior visit 2 years ago.  Reports of the past 6 months, balance has been progressively worsening but denies any other associated symptoms such as vertigo, visual changes, weakness or pain.  She does not exercise regularly but will walk on occasion.  She does admit to relatively sedentary lifestyle.  She is currently being evaluated by orthopedics and evaluation in the near future with neurosurgery for continued cervical complaints with right arm pain.  This has been present even prior to the stroke.  She does endorse compliance with medications and follows routinely with PCP for stroke risk factor management.     History provided for reference purposes only 11/11/17 UPDATE: Patient is being seen today for routine follow-up and is accompanied by her husband.  She states she continues to do well but does have intermittent unsteadiness but she feels as though this could be more related to anxiety from previous strokes.  Patient is currently on Effexor 75 mg and recommended to speak to PCP in regards to possibly increasing this for continued anxiety complaints.  She recently has stopped taking Plavix due to increased price of $200 per month after switching insurance companies.  Patient was advised to call insurance company to investigate whether this is a continued monthly  price or if she has a high deductible plan.  Patient was provided with good Rx coupon in order to receive Plavix from [facility] for $3 in the meantime.  She continues to take Lipitor without side effects of myalgias.  Patient will have PCP follow-up and lipid levels at follow-up appointment in August.  As she is a type I diabetic patient has insulin pump but states her glucose levels have been uncontrolled where they can be very low or very elevated.  She currently uses NovoLog in her insulin pump.  Patient is complaining of right shoulder pain that radiates down into her arm and her hand for which she has been experiencing for a couple years now.  She has seen Dr. Magnus Ivan in orthopedics prior where he ordered MRI but patient was unable to afford due to insurance issues.  Patient requesting MRI at this time but recommended to follow-up with Dr. Eliberto Ivory office.  Denies new or worsening stroke/TIA symptoms.  Visit 05/12/2017: Joyce Steele is a 59 year old female who is being seen today after a new stroke at [facility] on 02/16/17. All information obtained from patient and chart.through care everywhere. All imaging's and lab results personally reviewed. 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  Pt felt she becameweak on the left side on 02/16/17 and started leaning towards the left as though "someone was pulling me down". Denies right sided weakness or facial  droop. Denies speech difficulties or vision difficulties. MRI showed acute infarctions within right posterior frontal periventricular white matter and left cingulate subcortical white matter; MRI also showed several small chronic lacunar infarctions . CT scan negative . Carotid doppler negative for stenosis.transthoracic Echo negative for PFO. Pt was previously prescribed aspirin  and lipitor  but discontinued them (patient unsure the  date of stopping these medications and also cannot state why she stopped taking them). LDL was 146 and it was recommended during hospitalization to to restart lipitor  and aspirin . She was  started cozaar due to hypertension. Pt is currently being follow by endocrinology for her type 1 diabetes in which she uses in insulin pump. Patient declined PT/OT services. She states she is able to walk well except her left leg occasionally causes some imbalance Since discharge, pt states her balance is improving but still slightly off. She also has complaints of fatigue. She quit smoking after the strokein Nov 2018. Pt states her DMI has not been in good control. She has an appointment tomorrow with her endocrinologist to adjust her insulin levels in which she currently uses an insulin pump. Pt continues to take her lipitor  without side effects. Pt continues to take aspirin  and plavix  (which was prescribed at hospital discharge) at this time. Denies side effects such as upset stomach, increased bruising or increased bleeding. No recent increase in TIA/stroke symptoms.   Previous visit 08/06/15 Joyce Steele is an 59 y.o. female, right handed, with a past medical history significant for DM type 1, RA, smoking, admitted to [facility] for further evaluation of numbness right face-arm. Stated that she never had similar symptoms before. Went to bed 08/14/14 night feeling well but woke up around 8:45 next morning with a numb sensation involving her right face including right side of her lips and tongue as well as the right arm. (LKW 08/14/2014, time unknown). She denied associated HA, vertigo, double vision, difficulty swallowing, focal weakness, unsteadiness, confusion, slurred speech, language or vision impairment. She indicated that her symptoms did not get better and drove to her endocrinologist office who sent her to [facility] for urgent MRI brain. I did review her brain MRI and there is evidence of an acute left  lacunar infarct. Mrs. Uncapher said that her symptoms remain unchanged. Patient was not administered TPA secondary to delay in arrival. She was admitted for further evaluation and treatment. MRI scan of the brain showed acute nonhemorrhagic subcentimeter infarct involving the left thalamus which could explain her sensory symptoms. MRA of the brain showed moderate mid to distal posterior cerebral artery irregularity but no large vessel stenosis. Carotid ultrasound showed no significant stenosis transversely echo showed normal ejection fraction. LDL cholesterol is elevated and 153 and hemoglobin A1c at 6.9. Patient was started on Lipitor 40 Grams daily and aspirin as well as blood pressure medications. She states she's done well since discharge and numbness on the right side of the face as well as hand is reduced but it is still present. She is at a flareup and arthritis. She does not work as she is on disability from her rheumatoid arthritis. She had repeat lipid profile checked by her primary physician and it was fine last month. She remains on aspirin which is tolerating well with only minor bruising and no bleeding episodes. She has no new neurological complaints. Update 08/06/2015 : She returns for follow-up after last visit 6 months ago. He is doing well from the stroke standpoint without recurrent stroke  or TIAs. She still has some numbness around the right thumb which is not bothersome. She is remains on aspirin but does complain of bruising in her legs. She has had no bleeding problems. She is tolerating Lipitor well without any myalgias or arthralgias. She states her blood pressure is well controlled on metoprolol and today it is 129/81 office. She states her rheumatoid arthritis also appears to be quite well controlled and she sees Dr. Dierdre Forth for that. She has a new complaint today of right lateral neck pain which radiates down into her right thumb. This is a chronic problem upcoming for years and occurs  daily. Neck movements with a right painful. At times she has trouble sleeping because of the pain. She has had extensive evaluation for this 5 years ago including x-rays and MRI scan. She was told she had a bulging disc and she saw Dr. Magnus Ivan from orthopedics .She refused surgery and had epidural spinal injections but without relief. She is currently not taking any medication for this particular pain though in the past she has tried gabapentin which has not worked well.   ROS:   14 system review of systems is positive for see HPI and all other systems negative.   PMH:  Past Medical History:  Diagnosis Date  . Arthritis   . Diabetes mellitus without complication (HCC)   . Rheumatoid arthritis (HCC)   . Stroke [facility])     Social History:  Social History   Socioeconomic History  . Marital status: Legally Separated    Spouse name: Not on file  . Number of children: Not on file  . Years of education: Not on file  . Highest education level: Not on file  Occupational History  . Not on file  Tobacco Use  . Smoking status: Former Smoker    Packs/day: 0.25    Quit date: 03/01/2017    Years since quitting: 2.6  . Smokeless tobacco: Never Used  . Tobacco comment: pt smokes 4 cigarettes per day  Substance and Sexual Activity  . Alcohol use: No    Alcohol/week: 0.0 standard drinks  . Drug use: No  . Sexual activity: Not on file  Other Topics Concern  . Not on file  Social History Narrative  . Not on file   Social Determinants of Health   Financial Resource Strain:   . Difficulty of Paying Living Expenses:   Food Insecurity:   . Worried About Programme researcher, broadcasting/film/video in the Last Year:   . Barista in the Last Year:   Transportation Needs:   . Freight forwarder (Medical):   Marland Kitchen Lack of Transportation (Non-Medical):   Physical Activity:   . Days of Exercise per Week:   . Minutes of Exercise per Session:   Stress:   . Feeling of Stress :   Social Connections:   .  Frequency of Communication with Friends and Family:   . Frequency of Social Gatherings with Friends and Family:   . Attends Religious Services:   . Active Member of Clubs or Organizations:   . Attends Banker Meetings:   Marland Kitchen Marital Status:   Intimate Partner Violence:   . Fear of Current or Ex-Partner:   . Emotionally Abused:   Marland Kitchen Physically Abused:   . Sexually Abused:     Medications:   Current Outpatient Medications on File Prior to Visit  Medication Sig Dispense Refill  . etanercept (ENBREL) 50 MG/ML injection Inject 50 mg into the  skin once a week.    . folic acid (FOLVITE) 1 MG tablet Take 1 mg by mouth daily.    . insulin aspart (NOVOLOG) 100 UNIT/ML injection See admin instructions. Per insulin pump    . LORazepam (ATIVAN) 0.5 MG tablet every 6 (six) hours as needed.   2  . methotrexate (RHEUMATREX) 2.5 MG tablet Take 2.5 mg by mouth once a week. Caution:Chemotherapy. Protect from light.    . metoprolol succinate (TOPROL-XL) 25 MG 24 hr tablet TAKE 1 TABLET BY MOUTH at bedtime for blood pressure and pulse control  0  . mometasone (ELOCON) 0.1 % cream APP SMALL AMOUNT AA D  2  . venlafaxine XR (EFFEXOR-XR) 75 MG 24 hr capsule Take 75 mg by mouth daily with breakfast.    . zolpidem (AMBIEN CR) 12.5 MG CR tablet Take 12.5 mg by mouth at bedtime.      No current facility-administered medications on file prior to visit.    Allergies:   Allergies  Allergen Reactions  . Shellfish Allergy Anaphylaxis and Swelling  . Lyrica [Pregabalin] Swelling    Made the patient's feet swell      Today's Vitals   11/08/19 1253  BP: (!) 146/77  Pulse: 77  Weight: 130 lb (59 kg)  Height: 5\' 4"  (1.626 m)   Body mass index is 22.31 kg/m.   Physical Exam General: well developed, well nourished middle-aged Caucasian lady seated, in no evident distress Head: head normocephalic and atraumatic.  Neck: supple with no carotid or supraclavicular bruits Cardiovascular: regular  rate and rhythm, no murmurs Musculoskeletal: no deformity Skin:  no rash/petichiae Vascular:  Normal pulses all extremities  Neurologic Exam Mental Status: Awake and fully alert.  Fluent speech and language.  Oriented to place and time. Recent and remote memory intact. Attention span, concentration and fund of knowledge appropriate. Mood and affect appropriate.  Cranial Nerves: Pupils equal, briskly reactive to light. Extraocular movements full without nystagmus. Visual fields full to confrontation. Hearing intact. Facial sensation intact. Face, tongue, palate moves normally and symmetrically.  Motor: Normal bulk and tone.  4/5 weakness in bilateral hip flexors.  Mild RUE tricep and bicep weakness which is felt due to cervical condition.  No other weakness identified. Sensory.: intact to touch ,pinprick .position and vibratory sensation.  Coordination: Rapid alternating movements normal. Finger-to-nose and heel-to-shin performed accurately bilaterally.  Gait and Station: Arises from chair without difficulty. Stance is normal. Gait demonstrates normal stride length and mild imbalance initially.  Able to tandem walk and stand on one leg alone with mild difficulty.  Romberg negative. Reflexes: 1+ and symmetric. Toes downgoing.      ASSESSMENT/PLAN: 69 year Caucasian lady with history of left thalamic infarct in May 2016, right frontal white matter infarct and left subcortical whiter infarct on 02/16/17 secondary to small vessel disease with residual gait impairment.  Vascular risk factors of hypertension, diabetes and hyperlipidemia.     Gait impairment -Post stroke deficit with patient reporting gradual worsening over the past 6 months -Only focal deficit is bilateral lower hip flexor weakness (found on prior visit 2 years ago)  possibly due to deconditioning and sedentary lifestyle.  Encouraged increasing daily activity and establish routine exercise program.  Offered PT but she wishes to hold  off at this time but will call office in the future if interested in pursuing -We will hold off on imaging at this time as no focal deficit concerning for new stroke but discussed worsening of symptoms or new stroke/TIA  symptoms to call 911 immediately  History of multiple strokes -Continue clopidogrel 75 mg daily  and lipitor  for secondary stroke prevention  -Ensure close PCP follow-up for aggressive stroke risk factor management including HTN, HLD and DM  Cervicalgia -Chronic condition present prior to stroke -Continue to follow with orthopedics Dr. Magnus Ivan as well as evaluation by neurosurgery for possible need of further intervention   Stable from a neurological standpoint and recommend follow-up on an as-needed basis   I spent 30 minutes of face-to-face and non-face-to-face time with patient and husband.  This included previsit chart review, lab review, study review, order entry, electronic health record documentation, patient education regarding worsening gait impairment, prior history of strokes, cervicalgia and answered all questions to patient and husband satisfaction   Ihor Austin, AGNP-BC  [facility] [address] [place], [place] [address]  Phone [phone] Fax [phone] Note: This document was prepared with digital dictation and possible smart phrase technology. Any transcriptional errors that result from this process are unintentional.

## 2019-11-08 NOTE — Patient Instructions (Addendum)
Your Plan:  Recommend increasing activity and daily exercise as you have weakness in both of your hips which is likely due to deconditioning  Please call office if you would like to do formal outpatient physical therapy  Low suspicion for recurrent stroke or other abnormality that requires further imaging at this time.  Continue to follow with orthopedics and neurosurgery for neck pain         Thank you for coming to see [place] at [facility]. I hope we have been able to provide you high quality care today.  You may receive a patient satisfaction survey over the next few weeks. We would appreciate your feedback and comments so that we may continue to improve ourselves and the health of our patients.

## 2019-11-09 NOTE — Progress Notes (Signed)
I agree with the above plan 

## 2020-12-31 DIAGNOSIS — E109 Type 1 diabetes mellitus without complications: Secondary | ICD-10-CM | POA: Diagnosis not present

## 2021-01-21 DIAGNOSIS — Z794 Long term (current) use of insulin: Secondary | ICD-10-CM | POA: Diagnosis not present

## 2021-01-21 DIAGNOSIS — E1059 Type 1 diabetes mellitus with other circulatory complications: Secondary | ICD-10-CM | POA: Diagnosis not present

## 2021-01-21 DIAGNOSIS — Z4681 Encounter for fitting and adjustment of insulin pump: Secondary | ICD-10-CM | POA: Diagnosis not present

## 2021-01-21 DIAGNOSIS — M069 Rheumatoid arthritis, unspecified: Secondary | ICD-10-CM | POA: Diagnosis not present

## 2021-01-21 DIAGNOSIS — F320 Major depressive disorder, single episode, mild: Secondary | ICD-10-CM | POA: Diagnosis not present

## 2021-01-21 DIAGNOSIS — I6523 Occlusion and stenosis of bilateral carotid arteries: Secondary | ICD-10-CM | POA: Diagnosis not present

## 2021-01-21 DIAGNOSIS — M489 Spondylopathy, unspecified: Secondary | ICD-10-CM | POA: Diagnosis not present

## 2021-01-21 DIAGNOSIS — M8580 Other specified disorders of bone density and structure, unspecified site: Secondary | ICD-10-CM | POA: Diagnosis not present

## 2021-01-21 DIAGNOSIS — E785 Hyperlipidemia, unspecified: Secondary | ICD-10-CM | POA: Diagnosis not present

## 2021-01-21 DIAGNOSIS — Z8673 Personal history of transient ischemic attack (TIA), and cerebral infarction without residual deficits: Secondary | ICD-10-CM | POA: Diagnosis not present

## 2021-01-31 DIAGNOSIS — E109 Type 1 diabetes mellitus without complications: Secondary | ICD-10-CM | POA: Diagnosis not present

## 2021-03-02 DIAGNOSIS — E109 Type 1 diabetes mellitus without complications: Secondary | ICD-10-CM | POA: Diagnosis not present

## 2021-03-04 DIAGNOSIS — E109 Type 1 diabetes mellitus without complications: Secondary | ICD-10-CM | POA: Diagnosis not present

## 2021-03-04 DIAGNOSIS — Z8673 Personal history of transient ischemic attack (TIA), and cerebral infarction without residual deficits: Secondary | ICD-10-CM | POA: Diagnosis not present

## 2021-03-04 DIAGNOSIS — M810 Age-related osteoporosis without current pathological fracture: Secondary | ICD-10-CM | POA: Diagnosis not present

## 2021-03-04 DIAGNOSIS — M069 Rheumatoid arthritis, unspecified: Secondary | ICD-10-CM | POA: Diagnosis not present

## 2021-03-04 DIAGNOSIS — Z79899 Other long term (current) drug therapy: Secondary | ICD-10-CM | POA: Diagnosis not present

## 2021-03-25 DIAGNOSIS — E785 Hyperlipidemia, unspecified: Secondary | ICD-10-CM | POA: Diagnosis not present

## 2021-03-25 DIAGNOSIS — E1059 Type 1 diabetes mellitus with other circulatory complications: Secondary | ICD-10-CM | POA: Diagnosis not present

## 2021-03-25 DIAGNOSIS — Z794 Long term (current) use of insulin: Secondary | ICD-10-CM | POA: Diagnosis not present

## 2021-03-25 DIAGNOSIS — R030 Elevated blood-pressure reading, without diagnosis of hypertension: Secondary | ICD-10-CM | POA: Diagnosis not present

## 2021-03-25 DIAGNOSIS — Z4681 Encounter for fitting and adjustment of insulin pump: Secondary | ICD-10-CM | POA: Diagnosis not present

## 2021-04-01 DIAGNOSIS — E109 Type 1 diabetes mellitus without complications: Secondary | ICD-10-CM | POA: Diagnosis not present

## 2021-04-24 DIAGNOSIS — H5213 Myopia, bilateral: Secondary | ICD-10-CM | POA: Diagnosis not present

## 2021-05-07 DIAGNOSIS — E109 Type 1 diabetes mellitus without complications: Secondary | ICD-10-CM | POA: Diagnosis not present

## 2021-05-26 DIAGNOSIS — E1059 Type 1 diabetes mellitus with other circulatory complications: Secondary | ICD-10-CM | POA: Diagnosis not present

## 2021-05-26 DIAGNOSIS — M069 Rheumatoid arthritis, unspecified: Secondary | ICD-10-CM | POA: Diagnosis not present

## 2021-05-26 DIAGNOSIS — E559 Vitamin D deficiency, unspecified: Secondary | ICD-10-CM | POA: Diagnosis not present

## 2021-05-26 DIAGNOSIS — E785 Hyperlipidemia, unspecified: Secondary | ICD-10-CM | POA: Diagnosis not present

## 2021-05-26 DIAGNOSIS — F330 Major depressive disorder, recurrent, mild: Secondary | ICD-10-CM | POA: Diagnosis not present

## 2021-05-26 DIAGNOSIS — Z794 Long term (current) use of insulin: Secondary | ICD-10-CM | POA: Diagnosis not present

## 2021-05-26 DIAGNOSIS — R030 Elevated blood-pressure reading, without diagnosis of hypertension: Secondary | ICD-10-CM | POA: Diagnosis not present

## 2021-05-26 DIAGNOSIS — Z4681 Encounter for fitting and adjustment of insulin pump: Secondary | ICD-10-CM | POA: Diagnosis not present

## 2021-05-26 DIAGNOSIS — M8580 Other specified disorders of bone density and structure, unspecified site: Secondary | ICD-10-CM | POA: Diagnosis not present

## 2021-05-26 DIAGNOSIS — M7530 Calcific tendinitis of unspecified shoulder: Secondary | ICD-10-CM | POA: Diagnosis not present

## 2021-05-26 DIAGNOSIS — M489 Spondylopathy, unspecified: Secondary | ICD-10-CM | POA: Diagnosis not present

## 2021-05-26 DIAGNOSIS — Z8673 Personal history of transient ischemic attack (TIA), and cerebral infarction without residual deficits: Secondary | ICD-10-CM | POA: Diagnosis not present

## 2021-06-04 DIAGNOSIS — Z8673 Personal history of transient ischemic attack (TIA), and cerebral infarction without residual deficits: Secondary | ICD-10-CM | POA: Diagnosis not present

## 2021-06-04 DIAGNOSIS — Z79899 Other long term (current) drug therapy: Secondary | ICD-10-CM | POA: Diagnosis not present

## 2021-06-04 DIAGNOSIS — M810 Age-related osteoporosis without current pathological fracture: Secondary | ICD-10-CM | POA: Diagnosis not present

## 2021-06-04 DIAGNOSIS — E109 Type 1 diabetes mellitus without complications: Secondary | ICD-10-CM | POA: Diagnosis not present

## 2021-06-04 DIAGNOSIS — M069 Rheumatoid arthritis, unspecified: Secondary | ICD-10-CM | POA: Diagnosis not present

## 2021-06-06 DIAGNOSIS — E109 Type 1 diabetes mellitus without complications: Secondary | ICD-10-CM | POA: Diagnosis not present

## 2021-07-06 DIAGNOSIS — E109 Type 1 diabetes mellitus without complications: Secondary | ICD-10-CM | POA: Diagnosis not present

## 2021-09-10 DIAGNOSIS — E109 Type 1 diabetes mellitus without complications: Secondary | ICD-10-CM | POA: Diagnosis not present

## 2021-10-10 DIAGNOSIS — E109 Type 1 diabetes mellitus without complications: Secondary | ICD-10-CM | POA: Diagnosis not present

## 2021-10-24 DIAGNOSIS — F17200 Nicotine dependence, unspecified, uncomplicated: Secondary | ICD-10-CM | POA: Diagnosis not present

## 2021-10-24 DIAGNOSIS — Z794 Long term (current) use of insulin: Secondary | ICD-10-CM | POA: Diagnosis not present

## 2021-10-24 DIAGNOSIS — M8580 Other specified disorders of bone density and structure, unspecified site: Secondary | ICD-10-CM | POA: Diagnosis not present

## 2021-10-24 DIAGNOSIS — I6523 Occlusion and stenosis of bilateral carotid arteries: Secondary | ICD-10-CM | POA: Diagnosis not present

## 2021-10-24 DIAGNOSIS — E1059 Type 1 diabetes mellitus with other circulatory complications: Secondary | ICD-10-CM | POA: Diagnosis not present

## 2021-10-24 DIAGNOSIS — F418 Other specified anxiety disorders: Secondary | ICD-10-CM | POA: Diagnosis not present

## 2021-10-24 DIAGNOSIS — Z8673 Personal history of transient ischemic attack (TIA), and cerebral infarction without residual deficits: Secondary | ICD-10-CM | POA: Diagnosis not present

## 2021-10-24 DIAGNOSIS — F330 Major depressive disorder, recurrent, mild: Secondary | ICD-10-CM | POA: Diagnosis not present

## 2021-10-24 DIAGNOSIS — R030 Elevated blood-pressure reading, without diagnosis of hypertension: Secondary | ICD-10-CM | POA: Diagnosis not present

## 2021-10-24 DIAGNOSIS — Z4681 Encounter for fitting and adjustment of insulin pump: Secondary | ICD-10-CM | POA: Diagnosis not present

## 2021-10-24 DIAGNOSIS — E785 Hyperlipidemia, unspecified: Secondary | ICD-10-CM | POA: Diagnosis not present

## 2021-10-24 DIAGNOSIS — M069 Rheumatoid arthritis, unspecified: Secondary | ICD-10-CM | POA: Diagnosis not present

## 2021-11-09 DIAGNOSIS — E109 Type 1 diabetes mellitus without complications: Secondary | ICD-10-CM | POA: Diagnosis not present

## 2021-12-11 DIAGNOSIS — E109 Type 1 diabetes mellitus without complications: Secondary | ICD-10-CM | POA: Diagnosis not present

## 2022-01-10 DIAGNOSIS — E109 Type 1 diabetes mellitus without complications: Secondary | ICD-10-CM | POA: Diagnosis not present

## 2022-02-09 DIAGNOSIS — E109 Type 1 diabetes mellitus without complications: Secondary | ICD-10-CM | POA: Diagnosis not present

## 2022-03-18 DIAGNOSIS — E109 Type 1 diabetes mellitus without complications: Secondary | ICD-10-CM | POA: Diagnosis not present

## 2022-04-17 DIAGNOSIS — E109 Type 1 diabetes mellitus without complications: Secondary | ICD-10-CM | POA: Diagnosis not present

## 2022-05-17 DIAGNOSIS — E109 Type 1 diabetes mellitus without complications: Secondary | ICD-10-CM | POA: Diagnosis not present

## 2022-08-20 DIAGNOSIS — E1059 Type 1 diabetes mellitus with other circulatory complications: Secondary | ICD-10-CM | POA: Diagnosis not present

## 2022-08-20 DIAGNOSIS — Z4681 Encounter for fitting and adjustment of insulin pump: Secondary | ICD-10-CM | POA: Diagnosis not present

## 2022-08-20 DIAGNOSIS — Z794 Long term (current) use of insulin: Secondary | ICD-10-CM | POA: Diagnosis not present

## 2022-08-20 DIAGNOSIS — R030 Elevated blood-pressure reading, without diagnosis of hypertension: Secondary | ICD-10-CM | POA: Diagnosis not present

## 2022-08-20 DIAGNOSIS — E785 Hyperlipidemia, unspecified: Secondary | ICD-10-CM | POA: Diagnosis not present

## 2022-08-27 DIAGNOSIS — M069 Rheumatoid arthritis, unspecified: Secondary | ICD-10-CM | POA: Diagnosis not present

## 2022-08-27 DIAGNOSIS — Z8673 Personal history of transient ischemic attack (TIA), and cerebral infarction without residual deficits: Secondary | ICD-10-CM | POA: Diagnosis not present

## 2022-08-27 DIAGNOSIS — M810 Age-related osteoporosis without current pathological fracture: Secondary | ICD-10-CM | POA: Diagnosis not present

## 2022-08-27 DIAGNOSIS — Z79899 Other long term (current) drug therapy: Secondary | ICD-10-CM | POA: Diagnosis not present

## 2022-08-27 DIAGNOSIS — E109 Type 1 diabetes mellitus without complications: Secondary | ICD-10-CM | POA: Diagnosis not present

## 2022-08-27 DIAGNOSIS — Z111 Encounter for screening for respiratory tuberculosis: Secondary | ICD-10-CM | POA: Diagnosis not present

## 2022-09-15 DIAGNOSIS — E109 Type 1 diabetes mellitus without complications: Secondary | ICD-10-CM | POA: Diagnosis not present

## 2022-09-15 DIAGNOSIS — Z111 Encounter for screening for respiratory tuberculosis: Secondary | ICD-10-CM | POA: Diagnosis not present

## 2022-09-15 DIAGNOSIS — Z8673 Personal history of transient ischemic attack (TIA), and cerebral infarction without residual deficits: Secondary | ICD-10-CM | POA: Diagnosis not present

## 2022-09-15 DIAGNOSIS — M069 Rheumatoid arthritis, unspecified: Secondary | ICD-10-CM | POA: Diagnosis not present

## 2022-09-15 DIAGNOSIS — Z79899 Other long term (current) drug therapy: Secondary | ICD-10-CM | POA: Diagnosis not present

## 2022-09-15 DIAGNOSIS — M810 Age-related osteoporosis without current pathological fracture: Secondary | ICD-10-CM | POA: Diagnosis not present

## 2022-10-06 DIAGNOSIS — E109 Type 1 diabetes mellitus without complications: Secondary | ICD-10-CM | POA: Diagnosis not present

## 2023-01-13 DIAGNOSIS — E109 Type 1 diabetes mellitus without complications: Secondary | ICD-10-CM | POA: Diagnosis not present

## 2023-02-12 DIAGNOSIS — Z1389 Encounter for screening for other disorder: Secondary | ICD-10-CM | POA: Diagnosis not present

## 2023-02-12 DIAGNOSIS — I6523 Occlusion and stenosis of bilateral carotid arteries: Secondary | ICD-10-CM | POA: Diagnosis not present

## 2023-02-12 DIAGNOSIS — F418 Other specified anxiety disorders: Secondary | ICD-10-CM | POA: Diagnosis not present

## 2023-02-12 DIAGNOSIS — Z8673 Personal history of transient ischemic attack (TIA), and cerebral infarction without residual deficits: Secondary | ICD-10-CM | POA: Diagnosis not present

## 2023-02-12 DIAGNOSIS — E1059 Type 1 diabetes mellitus with other circulatory complications: Secondary | ICD-10-CM | POA: Diagnosis not present

## 2023-02-12 DIAGNOSIS — Z0000 Encounter for general adult medical examination without abnormal findings: Secondary | ICD-10-CM | POA: Diagnosis not present

## 2023-02-12 DIAGNOSIS — M069 Rheumatoid arthritis, unspecified: Secondary | ICD-10-CM | POA: Diagnosis not present

## 2023-02-12 DIAGNOSIS — Z4681 Encounter for fitting and adjustment of insulin pump: Secondary | ICD-10-CM | POA: Diagnosis not present

## 2023-02-12 DIAGNOSIS — E785 Hyperlipidemia, unspecified: Secondary | ICD-10-CM | POA: Diagnosis not present

## 2023-02-12 DIAGNOSIS — M8580 Other specified disorders of bone density and structure, unspecified site: Secondary | ICD-10-CM | POA: Diagnosis not present

## 2023-02-12 DIAGNOSIS — R030 Elevated blood-pressure reading, without diagnosis of hypertension: Secondary | ICD-10-CM | POA: Diagnosis not present

## 2023-03-12 DIAGNOSIS — R051 Acute cough: Secondary | ICD-10-CM | POA: Diagnosis not present

## 2023-03-12 DIAGNOSIS — B349 Viral infection, unspecified: Secondary | ICD-10-CM | POA: Diagnosis not present

## 2023-03-12 DIAGNOSIS — G4700 Insomnia, unspecified: Secondary | ICD-10-CM | POA: Diagnosis not present

## 2023-03-12 DIAGNOSIS — Z1152 Encounter for screening for COVID-19: Secondary | ICD-10-CM | POA: Diagnosis not present

## 2023-03-12 DIAGNOSIS — J029 Acute pharyngitis, unspecified: Secondary | ICD-10-CM | POA: Diagnosis not present

## 2023-03-12 DIAGNOSIS — E1059 Type 1 diabetes mellitus with other circulatory complications: Secondary | ICD-10-CM | POA: Diagnosis not present

## 2023-03-12 DIAGNOSIS — R0981 Nasal congestion: Secondary | ICD-10-CM | POA: Diagnosis not present

## 2023-03-12 DIAGNOSIS — Z794 Long term (current) use of insulin: Secondary | ICD-10-CM | POA: Diagnosis not present

## 2023-04-05 DIAGNOSIS — E1165 Type 2 diabetes mellitus with hyperglycemia: Secondary | ICD-10-CM | POA: Diagnosis not present

## 2023-05-14 DIAGNOSIS — Z4681 Encounter for fitting and adjustment of insulin pump: Secondary | ICD-10-CM | POA: Diagnosis not present

## 2023-05-14 DIAGNOSIS — E1059 Type 1 diabetes mellitus with other circulatory complications: Secondary | ICD-10-CM | POA: Diagnosis not present

## 2023-05-14 DIAGNOSIS — E785 Hyperlipidemia, unspecified: Secondary | ICD-10-CM | POA: Diagnosis not present

## 2023-05-14 DIAGNOSIS — R030 Elevated blood-pressure reading, without diagnosis of hypertension: Secondary | ICD-10-CM | POA: Diagnosis not present

## 2023-05-14 DIAGNOSIS — Z794 Long term (current) use of insulin: Secondary | ICD-10-CM | POA: Diagnosis not present

## 2023-07-01 DIAGNOSIS — E1165 Type 2 diabetes mellitus with hyperglycemia: Secondary | ICD-10-CM | POA: Diagnosis not present

## 2024-02-17 NOTE — Progress Notes (Addendum)
 Codie K. Crammer                                          MRN: [id number]   04/28/2024   The [facility] Quality Team Specialist reviewed this patient medical record for the purposes of chart review for care gap closure. The following were reviewed: chart review for care gap closure-diabetic eye exam and kidney health evaluation for diabetes:eGFR  and uACR.    [facility] Quality Team

## 2024-03-15 DIAGNOSIS — Z8673 Personal history of transient ischemic attack (TIA), and cerebral infarction without residual deficits: Secondary | ICD-10-CM | POA: Diagnosis not present

## 2024-03-15 DIAGNOSIS — M810 Age-related osteoporosis without current pathological fracture: Secondary | ICD-10-CM | POA: Diagnosis not present

## 2024-03-15 DIAGNOSIS — Z111 Encounter for screening for respiratory tuberculosis: Secondary | ICD-10-CM | POA: Diagnosis not present

## 2024-03-15 DIAGNOSIS — Z79899 Other long term (current) drug therapy: Secondary | ICD-10-CM | POA: Diagnosis not present

## 2024-03-15 DIAGNOSIS — E109 Type 1 diabetes mellitus without complications: Secondary | ICD-10-CM | POA: Diagnosis not present

## 2024-03-15 DIAGNOSIS — M069 Rheumatoid arthritis, unspecified: Secondary | ICD-10-CM | POA: Diagnosis not present
